# Patient Record
Sex: Male | Born: 1980
Health system: Southern US, Community
[De-identification: ages and names within clinical notes are randomized; demographics above are authoritative.]

## PROBLEM LIST (undated history)

## (undated) DIAGNOSIS — N200 Calculus of kidney: Secondary | ICD-10-CM

## (undated) DIAGNOSIS — I10 Essential (primary) hypertension: Secondary | ICD-10-CM

## (undated) DIAGNOSIS — I428 Other cardiomyopathies: Secondary | ICD-10-CM

## (undated) DIAGNOSIS — I5042 Chronic combined systolic (congestive) and diastolic (congestive) heart failure: Secondary | ICD-10-CM

## (undated) DIAGNOSIS — I2510 Atherosclerotic heart disease of native coronary artery without angina pectoris: Secondary | ICD-10-CM

## (undated) DIAGNOSIS — N289 Disorder of kidney and ureter, unspecified: Secondary | ICD-10-CM

## (undated) DIAGNOSIS — I447 Left bundle-branch block, unspecified: Secondary | ICD-10-CM

## (undated) DIAGNOSIS — G4733 Obstructive sleep apnea (adult) (pediatric): Secondary | ICD-10-CM

## (undated) HISTORY — DX: Calculus of kidney: N20.0

## (undated) HISTORY — DX: Morbid (severe) obesity due to excess calories: E66.01

## (undated) HISTORY — DX: Chronic combined systolic (congestive) and diastolic (congestive) heart failure: I50.42

## (undated) HISTORY — DX: Atherosclerotic heart disease of native coronary artery without angina pectoris: I25.10

## (undated) HISTORY — DX: Other cardiomyopathies: I42.8

## (undated) HISTORY — DX: Essential (primary) hypertension: I10

## (undated) HISTORY — DX: Left bundle-branch block, unspecified: I44.7

## (undated) HISTORY — DX: Obstructive sleep apnea (adult) (pediatric): G47.33

---

## 2013-03-22 ENCOUNTER — Ambulatory Visit (INDEPENDENT_AMBULATORY_CARE_PROVIDER_SITE_OTHER): Payer: BC Managed Care – PPO | Admitting: Emergency Medicine

## 2013-03-22 VITALS — BP 138/98 | HR 88 | Temp 99.9°F | Resp 18 | Ht 67.75 in | Wt 310.8 lb

## 2013-03-22 DIAGNOSIS — G4700 Insomnia, unspecified: Secondary | ICD-10-CM

## 2013-03-22 DIAGNOSIS — R490 Dysphonia: Secondary | ICD-10-CM

## 2013-03-22 DIAGNOSIS — J309 Allergic rhinitis, unspecified: Secondary | ICD-10-CM

## 2013-03-22 MED ORDER — FLUTICASONE PROPIONATE 50 MCG/ACT NA SUSP: 2.0000 | Freq: Every day | NASAL | Status: DC

## 2013-03-22 MED ORDER — BENZONATATE 100 MG PO CAPS: 100.0000 mg | ORAL_CAPSULE | Freq: Three times a day (TID) | ORAL | Status: DC | PRN

## 2013-03-22 NOTE — Patient Instructions (Signed)
Allergic Rhinitis Allergic rhinitis is when the mucous membranes in the nose respond to allergens. Allergens are particles in the air that cause your body to have an allergic reaction. This causes you to release allergic antibodies. Through a chain of events, these eventually cause you to release histamine into the blood stream (hence the use of antihistamines). Although meant to be protective to the body, it is this release that causes your discomfort, such as frequent sneezing, congestion and an itchy runny nose.  CAUSES  The pollen allergens may come from grasses, trees, and weeds. This is seasonal allergic rhinitis, or "hay fever." Other allergens cause year-round allergic rhinitis (perennial allergic rhinitis) such as house dust mite allergen, pet dander and mold spores.  SYMPTOMS   Nasal stuffiness (congestion).  Runny, itchy nose with sneezing and tearing of the eyes.  There is often an itching of the mouth, eyes and ears. It cannot be cured, but it can be controlled with medications. DIAGNOSIS  If you are unable to determine the offending allergen, skin or blood testing may find it. TREATMENT   Avoid the allergen.  Medications and allergy shots (immunotherapy) can help.  Hay fever may often be treated with antihistamines in pill or nasal spray forms. Antihistamines block the effects of histamine. There are over-the-counter medicines that may help with nasal congestion and swelling around the eyes. Check with your caregiver before taking or giving this medicine. If the treatment above does not work, there are many new medications your caregiver can prescribe. Stronger medications may be used if initial measures are ineffective. Desensitizing injections can be used if medications and avoidance fails. Desensitization is when a patient is given ongoing shots until the body becomes less sensitive to the allergen. Make sure you follow up with your caregiver if problems continue. SEEK MEDICAL  CARE IF:   You develop fever (more than 100.5 F (38.1 C).  You develop a cough that does not stop easily (persistent).  You have shortness of breath.  You start wheezing.  Symptoms interfere with normal daily activities. Document Released: 03/07/2001 Document Revised: 09/04/2011 Document Reviewed: 09/16/2008 ExitCare Patient Information 2014 [employer].  

## 2013-03-22 NOTE — Progress Notes (Signed)
  Subjective:    Patient ID: Alex Allen, male    DOB: 02-14-1981, 32 y.o.   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 States that upon weather changing he has had congestion and trouble controlling body temperature. Has vomited due to issues with coughing. Usually has troubles with these symptoms when seasons change. Denies anyone being sick at home, asthma, or ever having to use inhalers. Denies any one he has been around as being sick, to his knowledge.  Review of Systems     Objective:   Physical Exam patient is alert and cooperative. He does have a huskiness to his voice. TMs are clear. Nose is normal. Throat exam reveals some edema of the uvula but otherwise is unremarkable. His chest is clear to both auscultation and percussion. No wheezes are heard.  Results for orders placed in visit on 03/22/13  POCT RAPID STREP A (OFFICE)      Result Value Range   Rapid Strep A Screen Negative  Negative  GLUCOSE, POCT (MANUAL RESULT ENTRY)      Result Value Range   POC Glucose 99  70 - 99 mg/dl        Assessment & Plan:  Patient placed on Tessalon Perles Flonase and he will take Zyrtec one a day.

## 2013-03-24 ENCOUNTER — Other Ambulatory Visit: Payer: Self-pay | Admitting: Emergency Medicine

## 2013-03-24 DIAGNOSIS — J020 Streptococcal pharyngitis: Secondary | ICD-10-CM

## 2013-03-24 LAB — CULTURE, GROUP A STREP

## 2013-03-24 MED ORDER — AMOXICILLIN 875 MG PO TABS: 875.0000 mg | ORAL_TABLET | Freq: Two times a day (BID) | ORAL | Status: DC

## 2013-03-25 ENCOUNTER — Telehealth: Payer: Self-pay

## 2013-03-25 NOTE — Telephone Encounter (Signed)
Message copied by Fidel Levy on Tue Mar 25, 2013 11:44 AM ------      Message from: [place], [facility]      Created: Tue Mar 25, 2013  9:48 AM                   ----- Message -----         From: Collene Gobble, MD         Sent: 03/24/2013   8:02 PM           To: [facility] Clinical Message Pool            Call patient. Culture positive for strep.I sent in a presccription for amoxicillen. ------

## 2013-03-25 NOTE — Telephone Encounter (Signed)
Patient needs note for work for today.  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  Note written.

## 2016-05-08 ENCOUNTER — Ambulatory Visit (INDEPENDENT_AMBULATORY_CARE_PROVIDER_SITE_OTHER): Payer: BC Managed Care – PPO | Admitting: Family Medicine

## 2016-05-08 VITALS — BP 148/108 | HR 87 | Temp 98.7°F | Resp 16 | Ht 68.0 in | Wt 311.0 lb

## 2016-05-08 DIAGNOSIS — M5416 Radiculopathy, lumbar region: Secondary | ICD-10-CM

## 2016-05-08 DIAGNOSIS — Z23 Encounter for immunization: Secondary | ICD-10-CM

## 2016-05-08 DIAGNOSIS — I10 Essential (primary) hypertension: Secondary | ICD-10-CM

## 2016-05-08 MED ORDER — GABAPENTIN 100 MG PO CAPS: 100.0000 mg | ORAL_CAPSULE | Freq: Three times a day (TID) | ORAL | 3 refills | Status: DC

## 2016-05-08 MED ORDER — PREDNISONE 20 MG PO TABS: ORAL_TABLET | ORAL | 0 refills | Status: DC

## 2016-05-08 MED ORDER — LOSARTAN POTASSIUM 50 MG PO TABS: 50.0000 mg | ORAL_TABLET | Freq: Every day | ORAL | 3 refills | Status: DC

## 2016-05-08 MED ORDER — HYDROCHLOROTHIAZIDE 12.5 MG PO CAPS: 12.5000 mg | ORAL_CAPSULE | Freq: Every day | ORAL | 0 refills | Status: DC

## 2016-05-08 MED ORDER — LOSARTAN POTASSIUM 50 MG PO TABS: 50.0000 mg | ORAL_TABLET | Freq: Every day | ORAL | 0 refills | Status: DC

## 2016-05-08 MED ORDER — GABAPENTIN 100 MG PO CAPS: 100.0000 mg | ORAL_CAPSULE | Freq: Three times a day (TID) | ORAL | 1 refills | Status: DC

## 2016-05-08 MED ORDER — HYDROCHLOROTHIAZIDE 12.5 MG PO CAPS
12.5000 mg | ORAL_CAPSULE | Freq: Every day | ORAL | 0 refills | Status: DC
Start: 2016-05-08 — End: 2020-09-22

## 2016-05-08 NOTE — Patient Instructions (Addendum)
Lumbar Radiculopathy  Take Prednisone 20 mg,  in mornings with breakfast as follows:  Take 3 pills for 3 days, Take 2 pills for 3 days, and Take 1 pill for 3 days.  Complete all medication.   May take Gabapentin 100 mg up to 3 times daily for low back and hip pain.  Hypertension  Start Losartan 50 mg daily and Hydrochlorothiazide 12.5 mg daily. Medication refills have already been sent.    IF you received an x-ray today, you will receive an invoice from [facility]. Please contact [facility] at [phone] with questions or concerns regarding your invoice.   IF you received labwork today, you will receive an invoice from [employer]. Please contact [facility] at [phone] with questions or concerns regarding your invoice.   Our billing staff will not be able to assist you with questions regarding bills from these companies.  You will be contacted with the lab results as soon as they are available. The fastest way to get your results is to activate your My Chart account. Instructions are located on the last page of this paperwork. If you have not heard from [place] regarding the results in 2 weeks, please contact this office.     Hypertension Hypertension, commonly called high blood pressure, is when the force of blood pumping through your arteries is too strong. Your arteries are the blood vessels that carry blood from your heart throughout your body. A blood pressure reading consists of a higher number over a lower number, such as 110/72. The higher number (systolic) is the pressure inside your arteries when your heart pumps. The lower number (diastolic) is the pressure inside your arteries when your heart relaxes. Ideally you want your blood pressure below 120/80. Hypertension forces your heart to work harder to pump blood. Your arteries may become narrow or stiff. Having untreated or uncontrolled hypertension can cause heart attack, stroke,  kidney disease, and other problems. RISK FACTORS Some risk factors for high blood pressure are controllable. Others are not.  Risk factors you cannot control include:   Race. You may be at higher risk if you are African American.  Age. Risk increases with age.  Gender. Men are at higher risk than women before age 11 years. After age 51, women are at higher risk than men. Risk factors you can control include:  Not getting enough exercise or physical activity.  Being overweight.  Getting too much fat, sugar, calories, or salt in your diet.  Drinking too much alcohol. SIGNS AND SYMPTOMS Hypertension does not usually cause signs or symptoms. Extremely high blood pressure (hypertensive crisis) may cause headache, anxiety, shortness of breath, and nosebleed. DIAGNOSIS To check if you have hypertension, your health care provider will measure your blood pressure while you are seated, with your arm held at the level of your heart. It should be measured at least twice using the same arm. Certain conditions can cause a difference in blood pressure between your right and left arms. A blood pressure reading that is higher than normal on one occasion does not mean that you need treatment. If it is not clear whether you have high blood pressure, you may be asked to return on a different day to have your blood pressure checked again. Or, you may be asked to monitor your blood pressure at home for 1 or more weeks. TREATMENT Treating high blood pressure includes making lifestyle changes and possibly taking medicine. Living a healthy lifestyle can help lower high blood pressure.  You may need to change some of your habits. Lifestyle changes may include:  Following the DASH diet. This diet is high in fruits, vegetables, and whole grains. It is low in salt, red meat, and added sugars.  Keep your sodium intake below 2,300 mg per day.  Getting at least 30-45 minutes of aerobic exercise at least 4 times per  week.  Losing weight if necessary.  Not smoking.  Limiting alcoholic beverages.  Learning ways to reduce stress. Your health care provider may prescribe medicine if lifestyle changes are not enough to get your blood pressure under control, and if one of the following is true:  You are 4118-10159 years of age and your systolic blood pressure is above 140.  You are 36 years of age or older, and your systolic blood pressure is above 150.  Your diastolic blood pressure is above 90.  You have diabetes, and your systolic blood pressure is over 140 or your diastolic blood pressure is over 90.  You have kidney disease and your blood pressure is above 140/90.  You have heart disease and your blood pressure is above 140/90. Your personal target blood pressure may vary depending on your medical conditions, your age, and other factors. HOME CARE INSTRUCTIONS  Have your blood pressure rechecked as directed by your health care provider.   Take medicines only as directed by your health care provider. Follow the directions carefully. Blood pressure medicines must be taken as prescribed. The medicine does not work as well when you skip doses. Skipping doses also puts you at risk for problems.  Do not smoke.   Monitor your blood pressure at home as directed by your health care provider. SEEK MEDICAL CARE IF:   You think you are having a reaction to medicines taken.  You have recurrent headaches or feel dizzy.  You have swelling in your ankles.  You have trouble with your vision. SEEK IMMEDIATE MEDICAL CARE IF:  You develop a severe headache or confusion.  You have unusual weakness, numbness, or feel faint.  You have severe chest or abdominal pain.  You vomit repeatedly.  You have trouble breathing. MAKE SURE YOU:   Understand these instructions.  Will watch your condition.  Will get help right away if you are not doing well or get worse.   This information is not intended to  replace advice given to you by your health care provider. Make sure you discuss any questions you have with your health care provider.   Document Released: 06/12/2005 Document Revised: 10/27/2014 Document Reviewed: 04/04/2013 Elsevier Interactive Patient Education 2016 Elsevier Inc.  Sciatica Sciatica is pain, weakness, numbness, or tingling along your sciatic nerve. The nerve starts in the lower back and runs down the back of each leg. Nerve damage or certain conditions pinch or put pressure on the sciatic nerve. This causes the pain, weakness, and other discomforts of sciatica. HOME CARE   Only take medicine as told by your doctor.  Apply ice to the affected area for 20 minutes. Do this 3-4 times a day for the first 48-72 hours. Then try heat in the same way.  Exercise, stretch, or do your usual activities if these do not make your pain worse.  Go to physical therapy as told by your doctor.  Keep all doctor visits as told.  Do not wear high heels or shoes that are not supportive.  Get a firm mattress if your mattress is too soft to lessen pain and discomfort. GET HELP RIGHT  AWAY IF:   You cannot control when you poop (bowel movement) or pee (urinate).  You have more weakness in your lower back, lower belly (pelvis), butt (buttocks), or legs.  You have redness or puffiness (swelling) of your back.  You have a burning feeling when you pee.  You have pain that gets worse when you lie down.  You have pain that wakes you from your sleep.  Your pain is worse than past pain.  Your pain lasts longer than 4 weeks.  You are suddenly losing weight without reason. MAKE SURE YOU:   Understand these instructions.  Will watch this condition.  Will get help right away if you are not doing well or get worse.   This information is not intended to replace advice given to you by your health care provider. Make sure you discuss any questions you have with your health care provider.    Document Released: 03/21/2008 Document Revised: 03/03/2015 Document Reviewed: 10/22/2011 Elsevier Interactive Patient Education [employer]
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
## 2016-05-09 ENCOUNTER — Encounter: Payer: Self-pay | Admitting: Family Medicine

## 2016-05-09 ENCOUNTER — Telehealth: Payer: Self-pay

## 2016-05-09 LAB — COMPLETE METABOLIC PANEL WITH GFR
ALBUMIN: 4.3 g/dL (ref 3.6–5.1)
ALK PHOS: 94 U/L (ref 40–115)
ALT: 34 U/L (ref 9–46)
AST: 28 U/L (ref 10–40)
BILIRUBIN TOTAL: 0.4 mg/dL (ref 0.2–1.2)
BUN: 12 mg/dL (ref 7–25)
CO2: 27 mmol/L (ref 20–31)
Calcium: 9.6 mg/dL (ref 8.6–10.3)
Chloride: 102 mmol/L (ref 98–110)
Creat: 0.97 mg/dL (ref 0.60–1.35)
GFR, Est African American: >89 mL/min (ref >=60)
GFR, Est Non African American: >89 mL/min (ref >=60)
GLUCOSE: 85 mg/dL (ref 65–99)
Potassium: 4.1 mmol/L (ref 3.5–5.3)
SODIUM: 141 mmol/L (ref 135–146)
TOTAL PROTEIN: 7.6 g/dL (ref 6.1–8.1)

## 2016-05-09 LAB — CBC WITH DIFFERENTIAL/PLATELET
BASOS ABS: 0 {cells}/uL (ref 0–200)
Basophils Relative: 0 %
EOS ABS: 93 {cells}/uL (ref 15–500)
EOS PCT: 1 %
HCT: 47.9 % (ref 38.5–50.0)
HEMOGLOBIN: 16.3 g/dL (ref 13.2–17.1)
LYMPHS ABS: 2604 {cells}/uL (ref 850–3900)
Lymphocytes Relative: 28 %
MCH: 28.2 pg (ref 27.0–33.0)
MCHC: 34 g/dL (ref 32.0–36.0)
MCV: 82.7 fL (ref 80.0–100.0)
MONOS PCT: 9 %
MPV: 10.8 fL (ref 7.5–12.5)
Monocytes Absolute: 837 cells/uL (ref 200–950)
NEUTROS PCT: 62 %
Neutro Abs: 5766 cells/uL (ref 1500–7800)
Platelets: 224 10*3/uL (ref 140–400)
RBC: 5.79 MIL/uL (ref 4.20–5.80)
RDW: 14.3 % (ref 11.0–15.0)
WBC: 9.3 10*3/uL (ref 3.8–10.8)

## 2016-05-09 LAB — LIPID PANEL
CHOL/HDL RATIO: 4 ratio (ref <5.0)
CHOLESTEROL: 145 mg/dL (ref <200)
HDL: 36 mg/dL — AB (ref >40)
LDL Cholesterol: 90 mg/dL (ref <100)
TRIGLYCERIDES: 97 mg/dL (ref <150)
VLDL: 19 mg/dL (ref <30)

## 2016-05-09 LAB — THYROID PANEL WITH TSH
Free Thyroxine Index: 2.8 (ref 1.4–3.8)
T3 Uptake: 28 % (ref 22–35)
T4 TOTAL: 10 ug/dL (ref 4.5–12.0)
TSH: 1.62 m[IU]/L (ref 0.40–4.50)

## 2016-05-09 NOTE — Progress Notes (Signed)
Dear Alex Allen,  Below are the results from your recent visit were as expected.  Resulted Orders  COMPLETE METABOLIC PANEL WITH GFR  Result Value Ref Range   Sodium 141 135 - 146 mmol/L   Potassium 4.1 3.5 - 5.3 mmol/L   Chloride 102 98 - 110 mmol/L   CO2 27 20 - 31 mmol/L   Glucose, Bld 85 65 - 99 mg/dL   BUN 12 7 - 25 mg/dL   Creat 0.97 0.60 - 1.35 mg/dL   Total Bilirubin 0.4 0.2 - 1.2 mg/dL   Alkaline Phosphatase 94 40 - 115 U/L   AST 28 10 - 40 U/L   ALT 34 9 - 46 U/L   Total Protein 7.6 6.1 - 8.1 g/dL   Albumin 4.3 3.6 - 5.1 g/dL   Calcium 9.6 8.6 - 10.3 mg/dL   GFR, Est African American >89 >=60 mL/min   GFR, Est Non African American >89 >=60 mL/min   Narrative   Performed at:  [facility]                [address], [address]                [place], [place] [address]  CBC with Differential/Platelet  Result Value Ref Range   WBC 9.3 3.8 - 10.8 K/uL   RBC 5.79 4.20 - 5.80 MIL/uL   Hemoglobin 16.3 13.2 - 17.1 g/dL   HCT 47.9 38.5 - 50.0 %   MCV 82.7 80.0 - 100.0 fL   MCH 28.2 27.0 - 33.0 pg   MCHC 34.0 32.0 - 36.0 g/dL   RDW 14.3 11.0 - 15.0 %   Platelets 224 140 - 400 K/uL   MPV 10.8 7.5 - 12.5 fL   Neutro Abs 5,766 1,500 - 7,800 cells/uL   Lymphs Abs 2,604 850 - 3,900 cells/uL   Monocytes Absolute 837 200 - 950 cells/uL   Eosinophils Absolute 93 15 - 500 cells/uL   Basophils Absolute 0 0 - 200 cells/uL   Neutrophils Relative % 62 %   Lymphocytes Relative 28 %   Monocytes Relative 9 %   Eosinophils Relative 1 %   Basophils Relative 0 %   Smear Review Criteria for review not met    Narrative   Performed at:  [facility]                [address]                [place], [place] [address]  Thyroid Panel With TSH  Result Value Ref Range   T4, Total 10.0 4.5 - 12.0 ug/dL   T3 Uptake 28 22 - 35 %   Free Thyroxine Index 2.8 1.4 - 3.8   TSH 1.62 0.40 - 4.50 mIU/L   Narrative   Performed at:  [address], [address]                [place], [place] [address]  Lipid panel  Result Value Ref Range   Cholesterol 145 <200 mg/dL     Comment:     ** Please note change in reference range(s). **      Triglycerides 97 <150 mg/dL     Comment:     ** Please note change in reference range(s). **      HDL 36 (L) >40 mg/dL     Comment:     ** Please note change in reference range(s). **  Total CHOL/HDL Ratio 4.0 <5.0 Ratio   VLDL 19 <30 mg/dL   LDL Cholesterol 90 <100 mg/dL     Comment:     ** Please note change in reference range(s). **      Narrative   Performed at:  [address], [address]                [place], [place] [address]     If you have any questions or concerns, please don't hesitate to call.  Sincerely,   Alex Barrows, FNP

## 2016-05-09 NOTE — Telephone Encounter (Signed)
PATIENT STATES HE SAW KIMBERLY HARRIS ON Monday FOR HIS BACK PAIN. SHE WROTE HIM A NOTE TO RETURN TO WORK ON Wednesday NOV. 15, 2017. HE SAID WHEN HE STANDS FOR LONGER THAN 10 MINUTES HE GETS A SHOOTING PAIN UP HIS (L) LEG. 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 IF KIMBERLY THINKS HE SHOULD RETURN TO WORK ON Wednesday THAN HE NEEDS A NEW NOTE PUTTING HIM ON LIMITED DUTY SO THAT HE CAN WORK SEATED. HE SAID THE PAIN IS BETTER THAN IT WAS Monday. BEST PHONE [phone] (CELL) PLEASE CALL HIM AS SOON AS POSSIBLE.  MBC

## 2016-05-11 ENCOUNTER — Encounter: Payer: Self-pay | Admitting: Family Medicine

## 2016-05-11 NOTE — Telephone Encounter (Signed)
Call patient to advise patient I can give him a note placing him on limited duties at work or if he prefers I can write him out the remainder of the week. Please advise.

## 2016-05-11 NOTE — Telephone Encounter (Signed)
LMOM for pt giving him Kim's message.
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
## 2016-05-11 NOTE — Telephone Encounter (Signed)
Patient states that he is still having pain in his leg and he doesn't know if it is a good idea or not for him to be driving with a hurt leg since he is still getting sharp pains. He states he is having to go to work today because his job can not cover him but he would like to speak with Joaquin Courts about what he needs to do about this. Told him he may have to come in and be seen again but he wanted me to put in another phone message and see if she would just call him and talk to him about this.  His call back number is [phone].

## 2016-05-11 NOTE — Telephone Encounter (Signed)
Call patient to notify him a letter is at the front desk writing him out of work up until 05/16/16 and the letter indicates that he may return sooner if symptoms improve.

## 2016-05-16 ENCOUNTER — Ambulatory Visit
Admission: RE | Admit: 2016-05-16 | Discharge: 2016-05-16 | Disposition: A | Discharge status: Home / Self Care | Payer: Self-pay | Source: Ambulatory Visit | Attending: Family Medicine | Admitting: Family Medicine

## 2016-05-16 ENCOUNTER — Other Ambulatory Visit: Payer: Self-pay | Admitting: Family Medicine

## 2016-05-16 DIAGNOSIS — R52 Pain, unspecified: Secondary | ICD-10-CM

## 2020-08-24 DIAGNOSIS — I428 Other cardiomyopathies: Secondary | ICD-10-CM

## 2020-08-24 HISTORY — DX: Other cardiomyopathies: I42.8

## 2020-09-19 ENCOUNTER — Emergency Department (HOSPITAL_COMMUNITY): Payer: BC Managed Care – PPO

## 2020-09-19 ENCOUNTER — Inpatient Hospital Stay (HOSPITAL_COMMUNITY)
Admission: EM | Admit: 2020-09-19 | Discharge: 2020-09-22 | DRG: 286 | Disposition: A | Discharge status: Home / Self Care | Payer: BC Managed Care – PPO | Attending: Internal Medicine | Admitting: Internal Medicine

## 2020-09-19 ENCOUNTER — Other Ambulatory Visit: Payer: Self-pay

## 2020-09-19 ENCOUNTER — Encounter (HOSPITAL_COMMUNITY): Payer: Self-pay | Admitting: Emergency Medicine

## 2020-09-19 DIAGNOSIS — J810 Acute pulmonary edema: Secondary | ICD-10-CM

## 2020-09-19 DIAGNOSIS — Z6841 Body Mass Index (BMI) 40.0 and over, adult: Secondary | ICD-10-CM

## 2020-09-19 DIAGNOSIS — R739 Hyperglycemia, unspecified: Secondary | ICD-10-CM | POA: Diagnosis present

## 2020-09-19 DIAGNOSIS — J189 Pneumonia, unspecified organism: Secondary | ICD-10-CM | POA: Diagnosis present

## 2020-09-19 DIAGNOSIS — F17290 Nicotine dependence, other tobacco product, uncomplicated: Secondary | ICD-10-CM | POA: Diagnosis present

## 2020-09-19 DIAGNOSIS — I25110 Atherosclerotic heart disease of native coronary artery with unstable angina pectoris: Secondary | ICD-10-CM | POA: Diagnosis present

## 2020-09-19 DIAGNOSIS — I2489 Other forms of acute ischemic heart disease: Secondary | ICD-10-CM | POA: Diagnosis present

## 2020-09-19 DIAGNOSIS — I5041 Acute combined systolic (congestive) and diastolic (congestive) heart failure: Secondary | ICD-10-CM | POA: Clinically undetermined

## 2020-09-19 DIAGNOSIS — Z8249 Family history of ischemic heart disease and other diseases of the circulatory system: Secondary | ICD-10-CM

## 2020-09-19 DIAGNOSIS — I447 Left bundle-branch block, unspecified: Secondary | ICD-10-CM | POA: Diagnosis present

## 2020-09-19 DIAGNOSIS — Z83438 Family history of other disorder of lipoprotein metabolism and other lipidemia: Secondary | ICD-10-CM

## 2020-09-19 DIAGNOSIS — I248 Other forms of acute ischemic heart disease: Secondary | ICD-10-CM | POA: Diagnosis present

## 2020-09-19 DIAGNOSIS — I214 Non-ST elevation (NSTEMI) myocardial infarction: Secondary | ICD-10-CM | POA: Insufficient documentation

## 2020-09-19 DIAGNOSIS — I161 Hypertensive emergency: Secondary | ICD-10-CM | POA: Diagnosis present

## 2020-09-19 DIAGNOSIS — Z833 Family history of diabetes mellitus: Secondary | ICD-10-CM

## 2020-09-19 DIAGNOSIS — Z79899 Other long term (current) drug therapy: Secondary | ICD-10-CM

## 2020-09-19 DIAGNOSIS — E1165 Type 2 diabetes mellitus with hyperglycemia: Secondary | ICD-10-CM | POA: Diagnosis present

## 2020-09-19 DIAGNOSIS — R6510 Systemic inflammatory response syndrome (SIRS) of non-infectious origin without acute organ dysfunction: Secondary | ICD-10-CM | POA: Diagnosis present

## 2020-09-19 DIAGNOSIS — R0602 Shortness of breath: Secondary | ICD-10-CM

## 2020-09-19 DIAGNOSIS — I428 Other cardiomyopathies: Secondary | ICD-10-CM

## 2020-09-19 DIAGNOSIS — I110 Hypertensive heart disease with heart failure: Secondary | ICD-10-CM | POA: Diagnosis not present

## 2020-09-19 DIAGNOSIS — E876 Hypokalemia: Secondary | ICD-10-CM | POA: Diagnosis present

## 2020-09-19 DIAGNOSIS — E785 Hyperlipidemia, unspecified: Secondary | ICD-10-CM | POA: Diagnosis present

## 2020-09-19 DIAGNOSIS — Z20822 Contact with and (suspected) exposure to covid-19: Secondary | ICD-10-CM | POA: Diagnosis present

## 2020-09-19 DIAGNOSIS — J9600 Acute respiratory failure, unspecified whether with hypoxia or hypercapnia: Secondary | ICD-10-CM | POA: Diagnosis not present

## 2020-09-19 DIAGNOSIS — J9601 Acute respiratory failure with hypoxia: Secondary | ICD-10-CM | POA: Diagnosis present

## 2020-09-19 LAB — TROPONIN I (HIGH SENSITIVITY)
Troponin I (High Sensitivity): 56 ng/L — ABNORMAL HIGH (ref <18)
Troponin I (High Sensitivity): 309 ng/L (ref <18)

## 2020-09-19 LAB — BASIC METABOLIC PANEL
Anion gap: 10 (ref 5–15)
BUN: 9 mg/dL (ref 6–20)
CO2: 27 mmol/L (ref 22–32)
Calcium: 8.6 mg/dL — ABNORMAL LOW (ref 8.9–10.3)
Chloride: 99 mmol/L (ref 98–111)
Creatinine, Ser: 1.28 mg/dL — ABNORMAL HIGH (ref 0.61–1.24)
GFR, Estimated: >60 mL/min (ref >60)
Glucose, Bld: 205 mg/dL — ABNORMAL HIGH (ref 70–99)
Potassium: 3 mmol/L — ABNORMAL LOW (ref 3.5–5.1)
Sodium: 136 mmol/L (ref 135–145)

## 2020-09-19 LAB — CBC
HCT: 50.9 % (ref 39.0–52.0)
Hemoglobin: 17 g/dL (ref 13.0–17.0)
MCH: 28.7 pg (ref 26.0–34.0)
MCHC: 33.4 g/dL (ref 30.0–36.0)
MCV: 85.8 fL (ref 80.0–100.0)
Platelets: 271 10*3/uL (ref 150–400)
RBC: 5.93 MIL/uL — ABNORMAL HIGH (ref 4.22–5.81)
RDW: 13.6 % (ref 11.5–15.5)
WBC: 16.5 10*3/uL — ABNORMAL HIGH (ref 4.0–10.5)
nRBC: 0 % (ref 0.0–0.2)

## 2020-09-19 LAB — URINALYSIS, COMPLETE (UACMP) WITH MICROSCOPIC
Bilirubin Urine: NEGATIVE
Glucose, UA: NEGATIVE mg/dL
Ketones, ur: NEGATIVE mg/dL
Leukocytes,Ua: NEGATIVE
Nitrite: NEGATIVE
Protein, ur: 100 mg/dL — AB
Specific Gravity, Urine: 1.006 (ref 1.005–1.030)
pH: 6 (ref 5.0–8.0)

## 2020-09-19 LAB — I-STAT VENOUS BLOOD GAS, ED
Acid-Base Excess: 2 mmol/L (ref 0.0–2.0)
Bicarbonate: 29.9 mmol/L — ABNORMAL HIGH (ref 20.0–28.0)
Calcium, Ion: 1.03 mmol/L — ABNORMAL LOW (ref 1.15–1.40)
HCT: 53 % — ABNORMAL HIGH (ref 39.0–52.0)
Hemoglobin: 18 g/dL — ABNORMAL HIGH (ref 13.0–17.0)
O2 Saturation: 82 %
Potassium: 3.1 mmol/L — ABNORMAL LOW (ref 3.5–5.1)
Sodium: 139 mmol/L (ref 135–145)
TCO2: 32 mmol/L (ref 22–32)
pCO2, Ven: 56 mmHg (ref 44.0–60.0)
pH, Ven: 7.335 (ref 7.250–7.430)
pO2, Ven: 50 mmHg — ABNORMAL HIGH (ref 32.0–45.0)

## 2020-09-19 LAB — RESP PANEL BY RT-PCR (FLU A&B, COVID) ARPGX2
Influenza A by PCR: NEGATIVE
Influenza B by PCR: NEGATIVE
SARS Coronavirus 2 by RT PCR: NEGATIVE

## 2020-09-19 LAB — RAPID URINE DRUG SCREEN, HOSP PERFORMED
Amphetamines: NOT DETECTED
Barbiturates: NOT DETECTED
Benzodiazepines: NOT DETECTED
Cocaine: NOT DETECTED
Opiates: NOT DETECTED
Tetrahydrocannabinol: NOT DETECTED

## 2020-09-19 LAB — MAGNESIUM: Magnesium: 2 mg/dL (ref 1.7–2.4)

## 2020-09-19 LAB — BRAIN NATRIURETIC PEPTIDE: B Natriuretic Peptide: 181.5 pg/mL — ABNORMAL HIGH (ref 0.0–100.0)

## 2020-09-19 MED ORDER — HYDRALAZINE HCL 20 MG/ML IJ SOLN: 10.0000 mg | Freq: Four times a day (QID) | INTRAMUSCULAR | Status: DC | PRN

## 2020-09-19 MED ORDER — SODIUM CHLORIDE 0.9% FLUSH: 3.0000 mL | INTRAVENOUS | Status: DC | PRN

## 2020-09-19 MED ORDER — SODIUM CHLORIDE 0.9% FLUSH
3.0000 mL | Freq: Two times a day (BID) | INTRAVENOUS | Status: DC
  Administered 2020-09-19 – 2020-09-20 (×3): 3 mL via INTRAVENOUS

## 2020-09-19 MED ORDER — SODIUM CHLORIDE 0.9 % IV SOLN: 250.0000 mL | INTRAVENOUS | Status: DC | PRN

## 2020-09-19 MED ORDER — NITROGLYCERIN IN D5W 200-5 MCG/ML-% IV SOLN
5.0000 ug/min | INTRAVENOUS | Status: DC
  Administered 2020-09-19: 5 ug/min via INTRAVENOUS
  Filled 2020-09-19: qty 250

## 2020-09-19 MED ORDER — FUROSEMIDE 10 MG/ML IJ SOLN
40.0000 mg | Freq: Once | INTRAMUSCULAR | Status: AC
  Administered 2020-09-19: 40 mg via INTRAVENOUS
  Filled 2020-09-19: qty 4

## 2020-09-19 MED ORDER — FUROSEMIDE 10 MG/ML IJ SOLN
40.0000 mg | Freq: Two times a day (BID) | INTRAMUSCULAR | Status: DC
  Administered 2020-09-20 (×2): 40 mg via INTRAVENOUS
  Filled 2020-09-19 (×2): qty 4

## 2020-09-19 MED ORDER — LISINOPRIL 20 MG PO TABS
20.0000 mg | ORAL_TABLET | Freq: Every day | ORAL | Status: DC
  Administered 2020-09-19 – 2020-09-20 (×2): 20 mg via ORAL
  Filled 2020-09-19 (×2): qty 1

## 2020-09-19 MED ORDER — ONDANSETRON HCL 4 MG/2ML IJ SOLN: 4.0000 mg | Freq: Four times a day (QID) | INTRAMUSCULAR | Status: DC | PRN

## 2020-09-19 MED ORDER — HEPARIN (PORCINE) 25000 UT/250ML-% IV SOLN
1950.0000 [IU]/h | INTRAVENOUS | Status: DC
  Administered 2020-09-19: 1250 [IU]/h via INTRAVENOUS
  Administered 2020-09-20: 1600 [IU]/h via INTRAVENOUS
  Administered 2020-09-21: 1950 [IU]/h via INTRAVENOUS
  Filled 2020-09-19 (×3): qty 250

## 2020-09-19 MED ORDER — CARVEDILOL 3.125 MG PO TABS
3.1250 mg | ORAL_TABLET | Freq: Two times a day (BID) | ORAL | Status: DC
  Administered 2020-09-19 – 2020-09-22 (×6): 3.125 mg via ORAL
  Filled 2020-09-19 (×6): qty 1

## 2020-09-19 MED ORDER — ACETAMINOPHEN 325 MG PO TABS
650.0000 mg | ORAL_TABLET | ORAL | Status: DC | PRN
  Filled 2020-09-19: qty 2

## 2020-09-19 MED ORDER — HEPARIN BOLUS VIA INFUSION
4000.0000 [IU] | Freq: Once | INTRAVENOUS | Status: AC
  Administered 2020-09-19: 4000 [IU] via INTRAVENOUS
  Filled 2020-09-19: qty 4000

## 2020-09-19 MED ORDER — ASPIRIN 325 MG PO TABS
325.0000 mg | ORAL_TABLET | Freq: Every day | ORAL | Status: DC
  Administered 2020-09-19 – 2020-09-20 (×2): 325 mg via ORAL
  Filled 2020-09-19 (×2): qty 1

## 2020-09-19 MED ORDER — POTASSIUM CHLORIDE 10 MEQ/100ML IV SOLN
10.0000 meq | INTRAVENOUS | Status: AC
  Administered 2020-09-19 (×4): 10 meq via INTRAVENOUS
  Filled 2020-09-19 (×4): qty 100

## 2020-09-19 MED ORDER — ALBUTEROL SULFATE (2.5 MG/3ML) 0.083% IN NEBU: 2.5000 mg | INHALATION_SOLUTION | RESPIRATORY_TRACT | Status: DC | PRN

## 2020-09-19 NOTE — Progress Notes (Signed)
   09/19/20 2231  Assess: MEWS Score  BP (!) 150/111  Pulse Rate 100  Assess: MEWS Score  MEWS Temp 0  MEWS Systolic 0  MEWS Pulse 0  MEWS RR 2  MEWS LOC 0  MEWS Score 2  MEWS Score Color Yellow  Assess: if the MEWS score is Yellow or Red  Were vital signs taken at a resting state? Yes  Focused Assessment No change from prior assessment  Early Detection of Sepsis Score *See Row Information* Medium  MEWS guidelines implemented *See Row Information* Yes  Treat  MEWS Interventions Escalated (See documentation below)  Pain Scale 0-10  Take Vital Signs  Increase Vital Sign Frequency  Yellow: Q 2hr X 2 then Q 4hr X 2, if remains yellow, continue Q 4hrs  Escalate  MEWS: Escalate Yellow: discuss with charge nurse/RN and consider discussing with provider and RRT  Notify: Charge Nurse/RN  Name of Charge Nurse/RN Notified Noreene Larsson RN  Date Charge Nurse/RN Notified 09/19/20  Time Charge Nurse/RN Notified 2337  Document  Patient Outcome Not stable and remains on department

## 2020-09-19 NOTE — ED Triage Notes (Signed)
Pt bib [facility], respiratory distress. Pt was watching tv around 1600, started feeling short of breath and had a lot of spit, started as clear, is now pink and frothy. Pt in respiratory distress on arrival.   EMS VS:   BP:  180/ HR=160s RR=40s

## 2020-09-19 NOTE — ED Notes (Signed)
bipap removed, 5L [place] applied

## 2020-09-19 NOTE — Progress Notes (Signed)
ANTICOAGULATION CONSULT NOTE  Pharmacy Consult for Heparin Indication: chest pain/ACS  No Known Allergies  Patient Measurements: Height: 5\' 8"  (172.7 cm) Weight: (!) 140 kg (308 lb 10.3 oz) IBW/kg (Calculated) : 68.4 Heparin Dosing Weight: 101.8 kg  Vital Signs: Temp: 99.8 F (37.7 C) (03/27 2052) Temp Source: Axillary (03/27 2052) BP: 97/77 (03/27 2045) Pulse Rate: 114 (03/27 2045)  Labs: Recent Labs    09/19/20 1723 09/19/20 1737 09/19/20 1924  HGB 17.0 18.0*  --   HCT 50.9 53.0*  --   PLT 271  --   --   CREATININE 1.28*  --   --   TROPONINIHS 56*  --  309*    Estimated Creatinine Clearance: 104.2 mL/min (A) (by C-G formula based on SCr of 1.28 mg/dL (H)).   Medical History: Past Medical History:  Diagnosis Date  . Hypertension     Medications:  Scheduled:  . aspirin  325 mg Oral Daily  . carvedilol  3.125 mg Oral BID WC  . [START ON 09/20/2020] furosemide  40 mg Intravenous BID  . heparin  4,000 Units Intravenous Once  . sodium chloride flush  3 mL Intravenous Q12H    Assessment: Patient is a 28 yom that is being admitted for SOB/ACS. Patient had slightly elevated trops on first collection however this rose on repeat labs. Pharmacy has been asked to dose heparin at this time for ACS.   Goal of Therapy:  Heparin level 0.3-0.7 units/ml Monitor platelets by anticoagulation protocol: Yes   Plan:  - Heparin bolus 4000 units IV x 1 dose - Heparin drip @ 1250 units/hr - Heparin level in ~ 6 hours  - Monitor patient for s/s of bleeding and CBC while on heparin   46 PharmD. BCPS  09/19/2020,9:22 PM

## 2020-09-19 NOTE — ED Notes (Signed)
Attempted to call report, unsuccessful. Will call back.

## 2020-09-19 NOTE — H&P (Signed)
History and Physical    Alex Allen ZPH:[id number] DOB: [birth year] DOA: 09/19/2020  PCP: Bing Neighbors, FNP  Patient coming from:   Home via EMS  Chief Complaint:  Chief Complaint  Patient presents with  . Shortness of Breath     HPI:    40 year old male with past medical history of hypertension, obesity who presents to [facility] emergency department via EMS with sudden onset shortness of breath.  Patient explains that for the past several weeks he has been experiencing intermittent episodes of chest pressure.  Patient describes these episodes of chest pressure as consistently occurring with exertion and consistently resolving with rest.  Patient did not seek medical attention for this.  Patient additionally explains that for the past several days he has been feeling generalized malaise.  Patient denies any cough, fevers, sick contacts, change in appetite, lack of smell or lack of taste.  Patient denies any contacts with confirmed COVID-19 infection.    Patient's plans that shortly after 3 PM today he was lying on his couch watching TV when he suddenly experienced severe shortness of breath with associated chest tightness.  Onset was sudden and severe EMS was contacted who promptly arrived to evaluate the patient.  According to EMS, patient was covered in frothy sputum with initial blood pressures as high as 220 systolic with oxygen saturations as low as the upper 60s.  Patient refused CPAP and therefore was placed on nonrebreather mask and was promptly brought to [facility] emergency department for evaluation.  Upon evaluation in the emergency department, patient was found to be in respiratory distress and was transitioned to BiPAP therapy.  Patient was administered 40 mg intravenous Lasix.  Patient was continued to exhibit blood pressures as high as 230/130 and therefore was initiated on a nitroglycerin drip for blood pressure control.  Chest x-ray revealed  bilateral patchy traits concerning for acute pulmonary edema.  EKG revealed wide complex tachycardia which was reviewed with Dr. Lysle Morales with cardiology.  He was felt that EKG was secondary to a rate related left bundle branch block.  The hospitalist group was then called to assess the patient for admission to the hospital. Review of Systems:   Review of Systems  Constitutional: Positive for malaise/fatigue.  Respiratory: Positive for shortness of breath.   Cardiovascular: Positive for chest pain.  Neurological: Positive for weakness.  All other systems reviewed and are negative.   Past Medical History:  Diagnosis Date  . Hypertension     History reviewed. No pertinent surgical history.   reports that he has been smoking cigars. He has never used smokeless tobacco. He reports current alcohol use. He reports that he does not use drugs.  No Known Allergies  Family History  Problem Relation Age of Onset  . Thyroid disease Mother   . Diabetes Father   . Hypertension Father   . Hyperlipidemia Father      Prior to Admission medications   Medication Sig Start Date End Date Taking? Authorizing Provider  gabapentin (NEURONTIN) 100 MG capsule Take 1 capsule (100 mg total) by mouth 3 (three) times daily. 05/08/16   Bing Neighbors, FNP  hydrochlorothiazide (MICROZIDE) 12.5 MG capsule Take 1 capsule (12.5 mg total) by mouth daily. 05/08/16   Bing Neighbors, FNP  losartan (COZAAR) 50 MG tablet Take 1 tablet (50 mg total) by mouth daily. 05/08/16   Bing Neighbors, FNP  predniSONE (DELTASONE) 20 MG tablet Take 3 PO QAM x3days, 2 PO QAM  x3days, 1 PO QAM x3days 05/08/16   Bing Neighbors, FNP    Physical Exam: Vitals:   09/19/20 2015 09/19/20 2030 09/19/20 2045 09/19/20 2052  BP: (!) 139/111 128/73 97/77   Pulse: 60 (!) 118 (!) 114   Resp: (!) 26 (!) 28 (!) 24   Temp:    99.8 F (37.7 C)  TempSrc:    Axillary  SpO2: 98% 97% 98%   Weight:      Height:         Constitutional: Awake alert and oriented x3, patient is in respiratory distress.  Patient is obese. Skin: no rashes, no lesions, good skin turgor noted. Eyes: Pupils are equally reactive to light.  No evidence of scleral icterus or conjunctival pallor.  ENMT: BiPAP mask is in place.     Neck: normal, supple, no masses, no thyromegaly.  No evidence of jugular venous distension.   Respiratory: Coarse breath sounds in all fields with expiratory wheezing with significant rales noted in the bilateral mid and lower fields.  Increased respiratory effort without evidence of accessory muscle use..  Cardiovascular: Tachycardic rate with regular rhythm no murmurs / rubs / gallops.  +1 pitting edema the distal bilateral lower extremities.  2+ pedal pulses. No carotid bruits.  Chest:   Nontender without crepitus or deformity.   Back:   Nontender without crepitus or deformity. Abdomen: Abdomen is soft and nontender.  No evidence of intra-abdominal masses.  Positive bowel sounds noted in all quadrants.   Musculoskeletal: No joint deformity upper and lower extremities. Good ROM, no contractures. Normal muscle tone.  Neurologic: CN 2-12 grossly intact. Sensation intact.  Patient moving all 4 extremities spontaneously.  Patient is following all commands.  Patient is responsive to verbal stimuli.   Psychiatric: Patient exhibits normal mood with appropriate affect.  Patient seems to possess insight as to their current situation.     Labs on Admission: I have personally reviewed following labs and imaging studies -   CBC: Recent Labs  Lab 09/19/20 1723 09/19/20 1737  WBC 16.5*  --   HGB 17.0 18.0*  HCT 50.9 53.0*  MCV 85.8  --   PLT 271  --    Basic Metabolic Panel: Recent Labs  Lab 09/19/20 1723 09/19/20 1737  NA 136 139  K 3.0* 3.1*  CL 99  --   CO2 27  --   GLUCOSE 205*  --   BUN 9  --   CREATININE 1.28*  --   CALCIUM 8.6*  --   MG 2.0  --    GFR: Estimated Creatinine Clearance: 104.2  mL/min (A) (by C-G formula based on SCr of 1.28 mg/dL (H)). Liver Function Tests: No results for input(s): AST, ALT, ALKPHOS, BILITOT, PROT, ALBUMIN in the last 168 hours. No results for input(s): LIPASE, AMYLASE in the last 168 hours. No results for input(s): AMMONIA in the last 168 hours. Coagulation Profile: No results for input(s): INR, PROTIME in the last 168 hours. Cardiac Enzymes: No results for input(s): CKTOTAL, CKMB, CKMBINDEX, TROPONINI in the last 168 hours. BNP (last 3 results) No results for input(s): PROBNP in the last 8760 hours. HbA1C: No results for input(s): HGBA1C in the last 72 hours. CBG: No results for input(s): GLUCAP in the last 168 hours. Lipid Profile: No results for input(s): CHOL, HDL, LDLCALC, TRIG, CHOLHDL, LDLDIRECT in the last 72 hours. Thyroid Function Tests: No results for input(s): TSH, T4TOTAL, FREET4, T3FREE, THYROIDAB in the last 72 hours. Anemia Panel: No results for  input(s): VITAMINB12, FOLATE, FERRITIN, TIBC, IRON, RETICCTPCT in the last 72 hours. Urine analysis:    Component Value Date/Time   COLORURINE YELLOW 09/19/2020 2030   APPEARANCEUR CLEAR 09/19/2020 2030   LABSPEC 1.006 09/19/2020 2030   PHURINE 6.0 09/19/2020 2030   GLUCOSEU NEGATIVE 09/19/2020 2030   HGBUR SMALL (A) 09/19/2020 2030   BILIRUBINUR NEGATIVE 09/19/2020 2030   KETONESUR NEGATIVE 09/19/2020 2030   PROTEINUR 100 (A) 09/19/2020 2030   NITRITE NEGATIVE 09/19/2020 2030   LEUKOCYTESUR NEGATIVE 09/19/2020 2030    Radiological Exams on Admission - Personally Reviewed: DG Chest Portable 1 View  Result Date: 09/19/2020 CLINICAL DATA:  Shortness of breath, respiratory distress, pain conned frothy sputum, tachycardia, tachypnea EXAM: PORTABLE CHEST 1 VIEW COMPARISON:  Portable exam 1737 hours without priors for comparison FINDINGS: Enlargement of cardiac silhouette with pulmonary vascular congestion. Interstitial infiltrates bilaterally slightly greater on RIGHT which could  represent asymmetric pulmonary edema or multifocal infection. No pleural effusion or pneumothorax. Osseous structures unremarkable. IMPRESSION: Enlargement of cardiac silhouette with pulmonary vascular congestion. Asymmetric interstitial infiltrates greater on RIGHT, question asymmetric pulmonary edema versus infection. Electronically Signed   By: Ulyses Southward M.D.   On: 09/19/2020 17:50    EKG: Personally reviewed.  Rhythm is sinus tachycardia with heart rate of 131 bpm.  Evidence of left bundle branch block.   Assessment/Plan Principal Problem:   Acute respiratory failure with hypoxia (HCC)   Patient likely suffering from acute hypoxic respiratory failure secondary to acute cardiogenic pulmonary edema from hypertensive emergency  Patient is already been administered a dose of intravenous Lasix resulted in 600 cc of urine output and some.  We will continue intravenous Lasix is 40 mg twice daily to continue to pursue improvement in respiratory distress.    Patient is additionally been temporarily placed on BiPAP therapy as a temporizing measure which will quickly be weaned off as tolerated  Meanwhile, patient is on a nitroglycerin infusion which will attempt to be weaned off as we initiate oral antihypertensive therapy  Echocardiogram the morning  Placing patient in progressive unit  Active Problems:   Hypertensive emergency   Patient reports over 1 year of not taking any antihypertensives since he reports that his primary care provider took him off all antihypertensives at that time.  Patient presenting with severe hypertension with systolic blood pressures as high as 230 upon arrival to the emergency department, likely prompting his respiratory distress via acute cardiogenic pulmonary edema  Continuing nitroglycerin infusion but will quickly attempt to transition patient to oral antihypertensives through the evening.  Cycling cardiac enzymes  Monitoring patient telemetry  No  evidence of renal injury    NSTEMI (non-ST elevated myocardial infarction) (HCC)   Initial troponin 56 with follow-up troponin found to be 309  While this rise in troponin may all be secondary to hypertensive emergency, patient does endorse a recent history of exertional chest pain and pressure and patient additionally exhibits evidence of a new left bundle branch block  Will initially place patient on aspirin and heparin infusion and as we attempt to treat patient's blood pressure and respiratory failure  Echocardiogram ordered for the morning  Continue to cycle cardiac enzymes throughout the evening  Discussed with Dr. Lysle Morales with cardiology who agrees that plaque rupture is probably not the case but agrees with management and recommends echocardiogram and formal cardiology consultation in the morning.    SIRS (systemic inflammatory response syndrome) (HCC)   Patient exhibiting leukocytosis tachycardia and tachypnea  No recent history of  symptoms suggestive of underlying infection and therefore it is felt to be unlikely  That being said, considering leukocytosis and pulmonary infiltrates obtaining procalcitonin and CRP as well as blood cultures  Will abstain from antibiotics at this time but if patient develops fever or exhibits other obvious evidence of infection we will reconsider     Hypokalemia   Replacing with intravenous potassium chloride  Magnesium sulfate unremarkable  Monitor potassium levels with serial chemistries    Class 3 severe obesity due to excess calories with serious comorbidity and body mass index (BMI) of 45.0 to 49.9 in adult [facility])   Patient is clinically improved will counsel patient on caloric restriction and regular physical activity.    Hyperglycemia   Notable hyperglycemia initial chemistry  Obtaining hemoglobin A1c   Code Status:  Full code Family Communication: Friend is at bedside who has been updated on plan of care  Status is:  Observation  The patient remains OBS appropriate and will d/c before 2 midnights.  Dispo: The patient is from: Home              Anticipated d/c is to: Home              Patient currently is not medically stable to d/c.   Difficult to place patient No        Marinda Elk MD [facility] Pager [phone]  If 7PM-7AM, please contact night-coverage [email or web address] Use universal [place] password for that web site. If you do not have the password, please call the hospital operator.  09/19/2020, 9:14 PM

## 2020-09-19 NOTE — ED Provider Notes (Signed)
[facility] EMERGENCY DEPARTMENT Provider Note   CSN: [id number] Arrival date & time: 09/19/20  1718     History No chief complaint on file.   Alex Allen is a 40 y.o. male.  Level 5 caveat secondary to respiratory distress.  Per EMS no medical history no medications acute onset of shortness of breath.  Initial sats in the 60s.  Diaphoretic and rales in all lung fields.  No chest pain.  Arrives in respiratory distress.  Denies prior similar episode.  EMS said had some frothy sputum that ended up turning into his pink sputum during transport  The history is provided by the patient and the EMS personnel.  Shortness of Breath Severity:  Severe Onset quality:  Sudden Timing:  Constant Progression:  Worsening Chronicity:  New Relieved by:  Nothing Worsened by:  Nothing Ineffective treatments:  None tried Associated symptoms: cough   Associated symptoms: no abdominal pain, no chest pain, no fever and no sore throat        Past Medical History:  Diagnosis Date  . Hypertension     There are no problems to display for this patient.   History reviewed. No pertinent surgical history.     Family History  Problem Relation Age of Onset  . Thyroid disease Mother   . Diabetes Father   . Hypertension Father   . Hyperlipidemia Father     Social History   Tobacco Use  . Smoking status: Current Some Day Smoker  . Smokeless tobacco: Never Used  Substance Use Topics  . Alcohol use: Yes    Comment: Socially  . Drug use: No    Home Medications Prior to Admission medications   Medication Sig Start Date End Date Taking? Authorizing Provider  gabapentin (NEURONTIN) 100 MG capsule Take 1 capsule (100 mg total) by mouth 3 (three) times daily. 05/08/16   Bing Neighbors, FNP  hydrochlorothiazide (MICROZIDE) 12.5 MG capsule Take 1 capsule (12.5 mg total) by mouth daily. 05/08/16   Bing Neighbors, FNP  losartan (COZAAR) 50 MG tablet Take 1 tablet (50 mg  total) by mouth daily. 05/08/16   Bing Neighbors, FNP  predniSONE (DELTASONE) 20 MG tablet Take 3 PO QAM x3days, 2 PO QAM x3days, 1 PO QAM x3days 05/08/16   Bing Neighbors, FNP    Allergies    Patient has no known allergies.  Review of Systems   Review of Systems  Unable to perform ROS: Acuity of condition  Constitutional: Negative for fever.  HENT: Negative for sore throat.   Respiratory: Positive for cough and shortness of breath.   Cardiovascular: Negative for chest pain.  Gastrointestinal: Negative for abdominal pain.    Physical Exam Updated Vital Signs BP (!) 125/95 (BP Location: Right Arm)   Pulse 95   Temp 97.9 F (36.6 C) (Oral)   Resp 20   Ht 5\' 10"  (1.778 m)   Wt (!) 161.9 kg   SpO2 95%   BMI 51.21 kg/m   Physical Exam Vitals and nursing note reviewed.  Constitutional:      General: He is in acute distress.     Appearance: He is well-developed. He is obese.  HENT:     Head: Normocephalic and atraumatic.  Eyes:     Conjunctiva/sclera: Conjunctivae normal.  Cardiovascular:     Rate and Rhythm: Regular rhythm. Tachycardia present.     Pulses: Normal pulses.     Heart sounds: No murmur heard.   Pulmonary:  Effort: Tachypnea, accessory muscle usage and respiratory distress present.     Breath sounds: Rales present.  Abdominal:     Palpations: Abdomen is soft.     Tenderness: There is no abdominal tenderness.  Musculoskeletal:        General: No deformity or signs of injury. Normal range of motion.     Cervical back: Neck supple.     Right lower leg: No edema.     Left lower leg: No edema.  Skin:    General: Skin is warm and dry.  Neurological:     General: No focal deficit present.     Mental Status: He is alert.     ED Results / Procedures / Treatments   Labs (all labs ordered are listed, but only abnormal results are displayed) Labs Reviewed  BASIC METABOLIC PANEL - Abnormal; Notable for the following components:      Result  Value   Potassium 3.0 (*)    Glucose, Bld 205 (*)    Creatinine, Ser 1.28 (*)    Calcium 8.6 (*)    All other components within normal limits  BRAIN NATRIURETIC PEPTIDE - Abnormal; Notable for the following components:   B Natriuretic Peptide 181.5 (*)    All other components within normal limits  CBC - Abnormal; Notable for the following components:   WBC 16.5 (*)    RBC 5.93 (*)    All other components within normal limits  URINALYSIS, COMPLETE (UACMP) WITH MICROSCOPIC - Abnormal; Notable for the following components:   Hgb urine dipstick SMALL (*)    Protein, ur 100 (*)    Bacteria, UA RARE (*)    All other components within normal limits  C-REACTIVE PROTEIN - Abnormal; Notable for the following components:   CRP 2.7 (*)    All other components within normal limits  D-DIMER, QUANTITATIVE - Abnormal; Notable for the following components:   D-Dimer, Quant 2.81 (*)    All other components within normal limits  HEMOGLOBIN A1C - Abnormal; Notable for the following components:   Hgb A1c MFr Bld 7.1 (*)    All other components within normal limits  COMPREHENSIVE METABOLIC PANEL - Abnormal; Notable for the following components:   Glucose, Bld 130 (*)    Albumin 3.2 (*)    All other components within normal limits  CBC WITH DIFFERENTIAL/PLATELET - Abnormal; Notable for the following components:   WBC 11.3 (*)    All other components within normal limits  HEPARIN LEVEL (UNFRACTIONATED) - Abnormal; Notable for the following components:   Heparin Unfractionated 0.11 (*)    All other components within normal limits  LIPID PANEL - Abnormal; Notable for the following components:   HDL 34 (*)    LDL Cholesterol 111 (*)    All other components within normal limits  I-STAT VENOUS BLOOD GAS, ED - Abnormal; Notable for the following components:   pO2, Ven 50.0 (*)    Bicarbonate 29.9 (*)    Potassium 3.1 (*)    Calcium, Ion 1.03 (*)    HCT 53.0 (*)    Hemoglobin 18.0 (*)    All other  components within normal limits  TROPONIN I (HIGH SENSITIVITY) - Abnormal; Notable for the following components:   Troponin I (High Sensitivity) 56 (*)    All other components within normal limits  TROPONIN I (HIGH SENSITIVITY) - Abnormal; Notable for the following components:   Troponin I (High Sensitivity) 309 (*)    All other components within normal limits  TROPONIN I (HIGH SENSITIVITY) - Abnormal; Notable for the following components:   Troponin I (High Sensitivity) 989 (*)    All other components within normal limits  RESP PANEL BY RT-PCR (FLU A&B, COVID) ARPGX2  CULTURE, BLOOD (ROUTINE X 2)  CULTURE, BLOOD (ROUTINE X 2)  MAGNESIUM  RAPID URINE DRUG SCREEN, HOSP PERFORMED  PROCALCITONIN  HIV ANTIBODY (ROUTINE TESTING W REFLEX)    EKG EKG Interpretation  Date/Time:  Sunday September 19 2020 19:02:37 EDT Ventricular Rate:  131 PR Interval:    QRS Duration: 176 QT Interval:  423 QTC Calculation: 625 R Axis:   -35 Text Interpretation: Sinus tachycardia LAE, consider biatrial enlargement Left bundle branch block Confirmed by Meridee Score [phone]) on 09/19/2020 7:07:38 PM Also confirmed by Meridee Score [phone]), editor Elita Quick ([id number])  on 09/20/2020 7:51:13 AM   Radiology DG Chest Portable 1 View  Result Date: 09/19/2020 CLINICAL DATA:  Shortness of breath, respiratory distress, pain conned frothy sputum, tachycardia, tachypnea EXAM: PORTABLE CHEST 1 VIEW COMPARISON:  Portable exam 1737 hours without priors for comparison FINDINGS: Enlargement of cardiac silhouette with pulmonary vascular congestion. Interstitial infiltrates bilaterally slightly greater on RIGHT which could represent asymmetric pulmonary edema or multifocal infection. No pleural effusion or pneumothorax. Osseous structures unremarkable. IMPRESSION: Enlargement of cardiac silhouette with pulmonary vascular congestion. Asymmetric interstitial infiltrates greater on RIGHT, question asymmetric pulmonary edema  versus infection. Electronically Signed   By: Ulyses Southward M.D.   On: 09/19/2020 17:50    Procedures .Critical Care Performed by: Terrilee Files, MD Authorized by: Terrilee Files, MD   Critical care provider statement:    Critical care time (minutes):  60   Critical care time was exclusive of:  Separately billable procedures and treating other patients   Critical care was necessary to treat or prevent imminent or life-threatening deterioration of the following conditions:  Cardiac failure, respiratory failure and metabolic crisis   Critical care was time spent personally by me on the following activities:  Discussions with consultants, evaluation of patient's response to treatment, examination of patient, ordering and performing treatments and interventions, ordering and review of laboratory studies, ordering and review of radiographic studies, pulse oximetry, re-evaluation of patient's condition, obtaining history from patient or surrogate, review of old charts and development of treatment plan with patient or surrogate     Medications Ordered in ED Medications  sodium chloride flush (NS) 0.9 % injection 3 mL (3 mLs Intravenous Given 09/20/20 0840)  sodium chloride flush (NS) 0.9 % injection 3 mL (has no administration in time range)  0.9 %  sodium chloride infusion (has no administration in time range)  acetaminophen (TYLENOL) tablet 650 mg (has no administration in time range)  ondansetron (ZOFRAN) injection 4 mg (has no administration in time range)  furosemide (LASIX) injection 40 mg (40 mg Intravenous Given 09/20/20 0838)  carvedilol (COREG) tablet 3.125 mg (3.125 mg Oral Given 09/20/20 0838)  aspirin tablet 325 mg (325 mg Oral Given 09/20/20 0838)  heparin ADULT infusion 100 units/mL (25000 units/249mL) (1,250 Units/hr Intravenous Handoff 09/20/20 0724)  albuterol (PROVENTIL) (2.5 MG/3ML) 0.083% nebulizer solution 2.5 mg (has no administration in time range)  lisinopril (ZESTRIL)  tablet 20 mg (20 mg Oral Given 09/20/20 0838)  hydrALAZINE (APRESOLINE) injection 10 mg (has no administration in time range)  MEDLINE mouth rinse (has no administration in time range)  cefTRIAXone (ROCEPHIN) 1 g in sodium chloride 0.9 % 100 mL IVPB (1 g Intravenous New Bag/Given 09/20/20 0844)  azithromycin (ZITHROMAX) 250 mg  in dextrose 5 % 125 mL IVPB (has no administration in time range)  potassium chloride SA (KLOR-CON) CR tablet 40 mEq (has no administration in time range)  furosemide (LASIX) injection 40 mg (40 mg Intravenous Given 09/19/20 1726)  potassium chloride 10 mEq in 100 mL IVPB (0 mEq Intravenous Stopped 09/19/20 2331)  heparin bolus via infusion 4,000 Units (4,000 Units Intravenous Bolus from Bag 09/19/20 2237)    ED Course  I have reviewed the triage vital signs and the nursing notes.  Pertinent labs & imaging results that were available during my care of the patient were reviewed by me and considered in my medical decision making (see chart for details).  Clinical Course as of 09/20/20 1601  Wynelle Link Sep 19, 2020  1745 X-ray interpreted by me as cardiomegaly and signs of CHF. [MB]  1745 Patient on BiPAP and nitro drip.  IV Lasix has been given. [MB]  1829 Reassessment.  Heart rate down to 135.  Saturations good.  No diaphoresis.  Moving better air. [MB]  1913 Discussed with Dr. Molli Hazard cardiology fellow.  He said this could be a medical admission and if the team wants formal consult they can request it.  He would recommend an echo. [MB]  1948 Discussed with [facility] hospitalist Dr. Leafy Half who will evaluate the patient for admission. [MB]    Clinical Course User Index [MB] Terrilee Files, MD   MDM Rules/Calculators/A&P                          This patient complains of shortness of breath chest tightness; this involves an extensive number of treatment Options and is a complaint that carries with it a high risk of complications and Morbidity. The differential includes  hypoxia, ACS, PE, CHF, hypertensive emergency, dissection  I ordered, reviewed and interpreted labs, which included CBC with elevated white count, chemistries normal other than low potassium, troponin elevated will need to be trended, BNP elevated I ordered medication IV Lasix, IV nitroglycerin drip I ordered imaging studies which included chest x-ray and I independently    visualized and interpreted imaging which showed cardiomegaly and pulmonary edema Additional history obtained from EMS Previous records obtained and reviewed in epic, no recent visits I consulted cardiology Dr. Molli Hazard and [facility] hospitalist Dr. Leafy Half and discussed lab and imaging findings  Critical Interventions: Patient's hypertensive emergency respiratory distress hypoxia with aggressive interventions and BiPAP  After the interventions stated above, I reevaluated the patient and found patient's blood pressure to be normalizing and oxygenation better.  He is also breathing more comfortably.  He is agreeable to admission to the hospital for continued management.  Final Clinical Impression(s) / ED Diagnoses Final diagnoses:  Hypertensive emergency  Acute pulmonary edema (HCC)  Hypokalemia    Rx / DC Orders ED Discharge Orders    None       Terrilee Files, MD 09/20/20 [phone]

## 2020-09-20 ENCOUNTER — Encounter (HOSPITAL_COMMUNITY): Payer: Self-pay | Admitting: Internal Medicine

## 2020-09-20 ENCOUNTER — Observation Stay (HOSPITAL_BASED_OUTPATIENT_CLINIC_OR_DEPARTMENT_OTHER): Payer: BC Managed Care – PPO

## 2020-09-20 ENCOUNTER — Other Ambulatory Visit: Payer: Self-pay

## 2020-09-20 DIAGNOSIS — Z6841 Body Mass Index (BMI) 40.0 and over, adult: Secondary | ICD-10-CM | POA: Diagnosis not present

## 2020-09-20 DIAGNOSIS — Z20822 Contact with and (suspected) exposure to covid-19: Secondary | ICD-10-CM | POA: Diagnosis present

## 2020-09-20 DIAGNOSIS — J9601 Acute respiratory failure with hypoxia: Secondary | ICD-10-CM

## 2020-09-20 DIAGNOSIS — E785 Hyperlipidemia, unspecified: Secondary | ICD-10-CM | POA: Diagnosis present

## 2020-09-20 DIAGNOSIS — I110 Hypertensive heart disease with heart failure: Secondary | ICD-10-CM | POA: Diagnosis present

## 2020-09-20 DIAGNOSIS — Z79899 Other long term (current) drug therapy: Secondary | ICD-10-CM | POA: Diagnosis not present

## 2020-09-20 DIAGNOSIS — I161 Hypertensive emergency: Secondary | ICD-10-CM

## 2020-09-20 DIAGNOSIS — I248 Other forms of acute ischemic heart disease: Secondary | ICD-10-CM | POA: Diagnosis present

## 2020-09-20 DIAGNOSIS — E876 Hypokalemia: Secondary | ICD-10-CM

## 2020-09-20 DIAGNOSIS — I5041 Acute combined systolic (congestive) and diastolic (congestive) heart failure: Secondary | ICD-10-CM

## 2020-09-20 DIAGNOSIS — J9600 Acute respiratory failure, unspecified whether with hypoxia or hypercapnia: Secondary | ICD-10-CM | POA: Diagnosis present

## 2020-09-20 DIAGNOSIS — Z83438 Family history of other disorder of lipoprotein metabolism and other lipidemia: Secondary | ICD-10-CM | POA: Diagnosis not present

## 2020-09-20 DIAGNOSIS — I447 Left bundle-branch block, unspecified: Secondary | ICD-10-CM | POA: Diagnosis present

## 2020-09-20 DIAGNOSIS — I214 Non-ST elevation (NSTEMI) myocardial infarction: Secondary | ICD-10-CM

## 2020-09-20 DIAGNOSIS — I428 Other cardiomyopathies: Secondary | ICD-10-CM | POA: Diagnosis present

## 2020-09-20 DIAGNOSIS — I5021 Acute systolic (congestive) heart failure: Secondary | ICD-10-CM

## 2020-09-20 DIAGNOSIS — Z833 Family history of diabetes mellitus: Secondary | ICD-10-CM | POA: Diagnosis not present

## 2020-09-20 DIAGNOSIS — J189 Pneumonia, unspecified organism: Secondary | ICD-10-CM | POA: Diagnosis present

## 2020-09-20 DIAGNOSIS — E1165 Type 2 diabetes mellitus with hyperglycemia: Secondary | ICD-10-CM | POA: Diagnosis present

## 2020-09-20 DIAGNOSIS — I25110 Atherosclerotic heart disease of native coronary artery with unstable angina pectoris: Secondary | ICD-10-CM | POA: Diagnosis present

## 2020-09-20 DIAGNOSIS — F17290 Nicotine dependence, other tobacco product, uncomplicated: Secondary | ICD-10-CM | POA: Diagnosis present

## 2020-09-20 DIAGNOSIS — Z8249 Family history of ischemic heart disease and other diseases of the circulatory system: Secondary | ICD-10-CM | POA: Diagnosis not present

## 2020-09-20 HISTORY — PX: TRANSTHORACIC ECHOCARDIOGRAM: SHX275

## 2020-09-20 LAB — ECHOCARDIOGRAM COMPLETE
AR max vel: 3.77 cm2
AV Area VTI: 3.58 cm2
AV Area mean vel: 3.87 cm2
AV Mean grad: 3 mmHg
AV Peak grad: 5.3 mmHg
Ao pk vel: 1.15 m/s
Calc EF: 34.1 %
Height: 70 in
S' Lateral: 5.3 cm
Single Plane A2C EF: 41.1 %
Single Plane A4C EF: 27.8 %
Weight: 5710.4 oz

## 2020-09-20 LAB — COMPREHENSIVE METABOLIC PANEL
ALT: 31 U/L (ref 0–44)
AST: 40 U/L (ref 15–41)
Albumin: 3.2 g/dL — ABNORMAL LOW (ref 3.5–5.0)
Alkaline Phosphatase: 74 U/L (ref 38–126)
Anion gap: 6 (ref 5–15)
BUN: 9 mg/dL (ref 6–20)
CO2: 29 mmol/L (ref 22–32)
Calcium: 8.9 mg/dL (ref 8.9–10.3)
Chloride: 103 mmol/L (ref 98–111)
Creatinine, Ser: 0.96 mg/dL (ref 0.61–1.24)
GFR, Estimated: >60 mL/min (ref >60)
Glucose, Bld: 130 mg/dL — ABNORMAL HIGH (ref 70–99)
Potassium: 3.6 mmol/L (ref 3.5–5.1)
Sodium: 138 mmol/L (ref 135–145)
Total Bilirubin: 1 mg/dL (ref 0.3–1.2)
Total Protein: 6.8 g/dL (ref 6.5–8.1)

## 2020-09-20 LAB — HEMOGLOBIN A1C
Hgb A1c MFr Bld: 7.1 % — ABNORMAL HIGH (ref 4.8–5.6)
Mean Plasma Glucose: 157.07 mg/dL

## 2020-09-20 LAB — CBC WITH DIFFERENTIAL/PLATELET
Abs Immature Granulocytes: 0.05 10*3/uL (ref 0.00–0.07)
Basophils Absolute: 0 10*3/uL (ref 0.0–0.1)
Basophils Relative: 0 %
Eosinophils Absolute: 0.1 10*3/uL (ref 0.0–0.5)
Eosinophils Relative: 1 %
HCT: 45.5 % (ref 39.0–52.0)
Hemoglobin: 15.4 g/dL (ref 13.0–17.0)
Immature Granulocytes: 0 %
Lymphocytes Relative: 24 %
Lymphs Abs: 2.7 10*3/uL (ref 0.7–4.0)
MCH: 28.7 pg (ref 26.0–34.0)
MCHC: 33.8 g/dL (ref 30.0–36.0)
MCV: 84.7 fL (ref 80.0–100.0)
Monocytes Absolute: 0.9 10*3/uL (ref 0.1–1.0)
Monocytes Relative: 8 %
Neutro Abs: 7.5 10*3/uL (ref 1.7–7.7)
Neutrophils Relative %: 67 %
Platelets: 223 10*3/uL (ref 150–400)
RBC: 5.37 MIL/uL (ref 4.22–5.81)
RDW: 13.8 % (ref 11.5–15.5)
WBC: 11.3 10*3/uL — ABNORMAL HIGH (ref 4.0–10.5)
nRBC: 0 % (ref 0.0–0.2)

## 2020-09-20 LAB — LIPID PANEL
Cholesterol: 160 mg/dL (ref 0–200)
HDL: 34 mg/dL — ABNORMAL LOW (ref >40)
LDL Cholesterol: 111 mg/dL — ABNORMAL HIGH (ref 0–99)
Total CHOL/HDL Ratio: 4.7 RATIO
Triglycerides: 74 mg/dL (ref <150)
VLDL: 15 mg/dL (ref 0–40)

## 2020-09-20 LAB — GLUCOSE, CAPILLARY
Glucose-Capillary: 102 mg/dL — ABNORMAL HIGH (ref 70–99)
Glucose-Capillary: 135 mg/dL — ABNORMAL HIGH (ref 70–99)

## 2020-09-20 LAB — HEPARIN LEVEL (UNFRACTIONATED)
Heparin Unfractionated: 0.11 IU/mL — ABNORMAL LOW (ref 0.30–0.70)
Heparin Unfractionated: <0.1 IU/mL — ABNORMAL LOW (ref 0.30–0.70)

## 2020-09-20 LAB — PROCALCITONIN: Procalcitonin: 3.05 ng/mL

## 2020-09-20 LAB — TROPONIN I (HIGH SENSITIVITY): Troponin I (High Sensitivity): 989 ng/L (ref <18)

## 2020-09-20 LAB — HIV ANTIBODY (ROUTINE TESTING W REFLEX): HIV Screen 4th Generation wRfx: NONREACTIVE

## 2020-09-20 LAB — D-DIMER, QUANTITATIVE: D-Dimer, Quant: 2.81 ug/mL-FEU — ABNORMAL HIGH (ref 0.00–0.50)

## 2020-09-20 LAB — C-REACTIVE PROTEIN: CRP: 2.7 mg/dL — ABNORMAL HIGH (ref <1.0)

## 2020-09-20 MED ORDER — HEPARIN BOLUS VIA INFUSION
2200.0000 [IU] | Freq: Once | INTRAVENOUS | Status: AC
  Administered 2020-09-20: 2200 [IU] via INTRAVENOUS
  Filled 2020-09-20: qty 2200

## 2020-09-20 MED ORDER — LIVING WELL WITH DIABETES BOOK
Freq: Once | Status: AC
  Filled 2020-09-20: qty 1

## 2020-09-20 MED ORDER — ATORVASTATIN CALCIUM 10 MG PO TABS
20.0000 mg | ORAL_TABLET | Freq: Every day | ORAL | Status: DC
  Administered 2020-09-20: 20 mg via ORAL
  Filled 2020-09-20: qty 2

## 2020-09-20 MED ORDER — SODIUM CHLORIDE 0.9% FLUSH: 3.0000 mL | Freq: Two times a day (BID) | INTRAVENOUS | Status: DC

## 2020-09-20 MED ORDER — INSULIN ASPART 100 UNIT/ML ~~LOC~~ SOLN: 0.0000 [IU] | Freq: Three times a day (TID) | SUBCUTANEOUS | Status: DC

## 2020-09-20 MED ORDER — ORAL CARE MOUTH RINSE
15.0000 mL | Freq: Two times a day (BID) | OROMUCOSAL | Status: DC
  Administered 2020-09-20 (×2): 15 mL via OROMUCOSAL

## 2020-09-20 MED ORDER — PERFLUTREN LIPID MICROSPHERE
1.0000 mL | INTRAVENOUS | Status: AC | PRN
  Administered 2020-09-20: 4 mL via INTRAVENOUS
  Filled 2020-09-20: qty 10

## 2020-09-20 MED ORDER — INSULIN ASPART 100 UNIT/ML ~~LOC~~ SOLN: 0.0000 [IU] | Freq: Every day | SUBCUTANEOUS | Status: DC

## 2020-09-20 MED ORDER — DEXTROSE 5 % IV SOLN
250.0000 mg | INTRAVENOUS | Status: DC
  Administered 2020-09-20 – 2020-09-22 (×3): 250 mg via INTRAVENOUS
  Filled 2020-09-20 (×5): qty 250

## 2020-09-20 MED ORDER — HEPARIN BOLUS VIA INFUSION
2000.0000 [IU] | Freq: Once | INTRAVENOUS | Status: AC
  Administered 2020-09-20: 2000 [IU] via INTRAVENOUS
  Filled 2020-09-20: qty 2000

## 2020-09-20 MED ORDER — SACUBITRIL-VALSARTAN 49-51 MG PO TABS
1.0000 | ORAL_TABLET | Freq: Two times a day (BID) | ORAL | Status: DC
  Administered 2020-09-21 – 2020-09-22 (×2): 1 via ORAL
  Filled 2020-09-20 (×3): qty 1

## 2020-09-20 MED ORDER — SODIUM CHLORIDE 0.9 % IV SOLN
1.0000 g | INTRAVENOUS | Status: DC
  Administered 2020-09-20 – 2020-09-22 (×3): 1 g via INTRAVENOUS
  Filled 2020-09-20 (×3): qty 10

## 2020-09-20 MED ORDER — POTASSIUM CHLORIDE CRYS ER 20 MEQ PO TBCR
40.0000 meq | EXTENDED_RELEASE_TABLET | Freq: Once | ORAL | Status: AC
  Administered 2020-09-20: 40 meq via ORAL
  Filled 2020-09-20: qty 2

## 2020-09-20 NOTE — H&P (View-Only) (Signed)
Cardiology Consultation:   Patient ID: Alex Allen MRN: [id number]; DOB: [birth year]  Admit date: 09/19/2020 Date of Consult: 09/20/2020  PCP:  Bing Neighbors, FNP   [facility]  Cardiologist:  New -Dr. Herbie Baltimore Advanced Practice Provider:  No care team member to display Electrophysiologist:  None [id number]    Patient Profile:   Alex Allen is a 40 y.o. male with a hx of  hypertension and morbid obesity with BMI of 51 who is being seen today for the evaluation of hypertensive emergency, acute CHF and elevated troponin at the request of Dr. Alvino Chapel.  History of Present Illness:   Alex Allen is a pleasant 40 year old male with past medical history of hypertension and morbid obesity with BMI of 51 who presented with increasing shortness of breath since Sunday night.  Previously, he was on losartan and hydrochlorothiazide, however his PCP discontinued both medication about a year ago as his blood pressure was good.  Since his PCP is not part of [facility], I am unable to see the report.  Yesterday, he had increasing dyspnea prompted the patient to seek medical attention at [facility]. He has been having intermittent chest pressure lately near the end of the day after he rested some.  He denies any significant chest discomfort during the day when he is walking around and exerting himself.  The chest discomfort is described as a pressure-like sensation that lasts only a few seconds before going away.  On EMS arrival, he had frothy sputum with initial blood pressure of 220.  He refused CPAP therapy and was eventually placed on nonrebreather mask.  On arrival to [facility] ED, he was transitioned to BiPAP therapy due to respiratory distress.  EKG shows sinus tachycardia with left bundle branch block.  Chest x-ray was concerning for acute CHF exacerbation.  Patient was placed on IV Lasix and IV nitroglycerin.  Since then, he has been placed on 20 mg of lisinopril and 3.125 mg  twice a day of carvedilol.  Blood pressure has returned to normal.  Initial high-sensitivity troponin was 56, however this subsequently trended up to 989.  D-dimer was elevated at 2.81.  Fasting lipid panel shows mildly elevated LDL of 111, low HDL.  Hemoglobin A1c was 7.1 which placed the patient in new diabetes.  Echocardiogram has been obtained and is currently pending.  Cardiology service has been consulted for elevated troponin, acute CHF and hypertensive emergency.   Past Medical History:  Diagnosis Date  . Hypertension     History reviewed. No pertinent surgical history.   Home Medications:  Prior to Admission medications   Medication Sig Start Date End Date Taking? Authorizing Provider  gabapentin (NEURONTIN) 100 MG capsule Take 1 capsule (100 mg total) by mouth 3 (three) times daily. 05/08/16   Bing Neighbors, FNP  hydrochlorothiazide (MICROZIDE) 12.5 MG capsule Take 1 capsule (12.5 mg total) by mouth daily. 05/08/16   Bing Neighbors, FNP  losartan (COZAAR) 50 MG tablet Take 1 tablet (50 mg total) by mouth daily. 05/08/16   Bing Neighbors, FNP  predniSONE (DELTASONE) 20 MG tablet Take 3 PO QAM x3days, 2 PO QAM x3days, 1 PO QAM x3days 05/08/16   Bing Neighbors, FNP    Inpatient Medications: Scheduled Meds: . aspirin  325 mg Oral Daily  . carvedilol  3.125 mg Oral BID WC  . furosemide  40 mg Intravenous BID  . insulin aspart  0-5 Units Subcutaneous QHS  . insulin aspart  0-9 Units Subcutaneous TID WC  . lisinopril  20 mg Oral Daily  . mouth rinse  15 mL Mouth Rinse BID  . sodium chloride flush  3 mL Intravenous Q12H   Continuous Infusions: . sodium chloride    . azithromycin 250 mg (09/20/20 1040)  . cefTRIAXone (ROCEPHIN)  IV 1 g (09/20/20 0844)  . heparin 1,600 Units/hr (09/20/20 1128)   PRN Meds: sodium chloride, acetaminophen, albuterol, hydrALAZINE, ondansetron (ZOFRAN) IV, perflutren lipid microspheres (DEFINITY) IV suspension, sodium chloride  flush  Allergies:   No Known Allergies  Social History:   Social History   Socioeconomic History  . Marital status: Single    Spouse name: Not on file  . Number of children: Not on file  . Years of education: Not on file  . Highest education level: Not on file  Occupational History  . Not on file  Tobacco Use  . Smoking status: Current Some Day Smoker    Types: Cigars  . Smokeless tobacco: Never Used  Substance and Sexual Activity  . Alcohol use: Yes    Comment: Socially  . Drug use: No  . Sexual activity: Yes  Other Topics Concern  . Not on file  Social History Narrative  . Not on file   Social Determinants of Health   Financial Resource Strain: Not on file  Food Insecurity: Not on file  Transportation Needs: Not on file  Physical Activity: Not on file  Stress: Not on file  Social Connections: Not on file  Intimate Partner Violence: Not on file    Family History:    Family History  Problem Relation Age of Onset  . Thyroid disease Mother   . Diabetes Father   . Hypertension Father   . Hyperlipidemia Father      ROS:  Please see the history of present illness.   All other ROS reviewed and negative.     Physical Exam/Data:   Vitals:   09/20/20 0224 09/20/20 0524 09/20/20 0623 09/20/20 0824  BP: 100/71 104/77 (!) 125/95 111/78  Pulse: 94 80 95 88  Resp: 20     Temp: 97.8 F (36.6 C)  97.9 F (36.6 C) 98.3 F (36.8 C)  TempSrc: Oral  Oral   SpO2: 93% 92% 95% 99%  Weight: (!) 161.9 kg     Height:        Intake/Output Summary (Last 24 hours) at 09/20/2020 1229 Last data filed at 09/20/2020 1044 Gross per 24 hour  Intake 569.47 ml  Output 1900 ml  Net -1330.53 ml   Last 3 Weights 09/20/2020 09/19/2020 09/19/2020  Weight (lbs) 356 lb 14.4 oz 359 lb 9.6 oz 308 lb 10.3 oz  Weight (kg) 161.889 kg 163.113 kg 140 kg     Body mass index is 51.21 kg/m.  General:  Well nourished, well developed, in no acute distress HEENT: normal Lymph: no  adenopathy Neck: no JVD Endocrine:  No thryomegaly Vascular: No carotid bruits; FA pulses 2+ bilaterally without bruits  Cardiac:  normal S1, S2; RRR; no murmur  Lungs:  clear to auscultation bilaterally, no wheezing, rhonchi or rales  Abd: soft, nontender, no hepatomegaly  Ext: no edema Musculoskeletal:  No deformities, BUE and BLE strength normal and equal Skin: warm and dry  Neuro:  CNs 2-12 intact, no focal abnormalities noted Psych:  Normal affect   EKG:  The EKG was personally reviewed and demonstrates: Sinus tachycardia with left bundle branch block. Telemetry:  Telemetry was personally reviewed and demonstrates: Sinus rhythm,   left bundle branch block, heart rate better controlled  Relevant CV Studies:  Pending echo  Laboratory Data:  High Sensitivity Troponin:   Recent Labs  Lab 09/19/20 1723 09/19/20 1924 09/20/20 0001  TROPONINIHS 56* 309* 989*     Chemistry Recent Labs  Lab 09/19/20 1723 09/19/20 1737 09/20/20 0701  NA 136 139 138  K 3.0* 3.1* 3.6  CL 99  --  103  CO2 27  --  29  GLUCOSE 205*  --  130*  BUN 9  --  9  CREATININE 1.28*  --  0.96  CALCIUM 8.6*  --  8.9  GFRNONAA >60  --  >60  ANIONGAP 10  --  6    Recent Labs  Lab 09/20/20 0701  PROT 6.8  ALBUMIN 3.2*  AST 40  ALT 31  ALKPHOS 74  BILITOT 1.0   Hematology Recent Labs  Lab 09/19/20 1723 09/19/20 1737 09/20/20 0701  WBC 16.5*  --  11.3*  RBC 5.93*  --  5.37  HGB 17.0 18.0* 15.4  HCT 50.9 53.0* 45.5  MCV 85.8  --  84.7  MCH 28.7  --  28.7  MCHC 33.4  --  33.8  RDW 13.6  --  13.8  PLT 271  --  223   BNP Recent Labs  Lab 09/19/20 1723  BNP 181.5*    DDimer  Recent Labs  Lab 09/19/20 2338  DDIMER 2.81*     Radiology/Studies:  DG Chest Portable 1 View  Result Date: 09/19/2020 CLINICAL DATA:  Shortness of breath, respiratory distress, pain conned frothy sputum, tachycardia, tachypnea EXAM: PORTABLE CHEST 1 VIEW COMPARISON:  Portable exam 1737 hours without  priors for comparison FINDINGS: Enlargement of cardiac silhouette with pulmonary vascular congestion. Interstitial infiltrates bilaterally slightly greater on RIGHT which could represent asymmetric pulmonary edema or multifocal infection. No pleural effusion or pneumothorax. Osseous structures unremarkable. IMPRESSION: Enlargement of cardiac silhouette with pulmonary vascular congestion. Asymmetric interstitial infiltrates greater on RIGHT, question asymmetric pulmonary edema versus infection. Electronically Signed   By: Ulyses Southward M.D.   On: 09/19/2020 17:50   Cardiology Studies:   TTE 09/20/2020: EF~30%.  Moderate to severely reduced function.  Regional wall motion abnormality: Basal to mid inferior and inferoseptal AK.  Anteroseptal severe HK.  Mild dilation.  Moderate LVH.  Unable to assess diastolic parameters.  Also unable to assess PA pressures, but RV systolic function appears normal.  LA is mildly dilated.  No significant valvular lesions.  Severely dilated IVC suggestive of elevated RAP/CVP 15 mmHg.   Assessment and Plan:   1. Hypertensive emergency  -Per patient, he was taken off of 50 mg losartan and 12.5 mg daily of hydrochlorothiazide a year ago by his PCP  -On arrival to the ED yesterday, systolic blood pressure was over 200.  He does not typically check his blood pressure at home  -Since then, he has been placed on 3.125 mg twice daily of carvedilol and 20 mg daily of lisinopril.  Blood pressure is now in the 120s range.  Acute Combined Systolic and Diastolic CHF: Preliminary review of echocardiogram suggest EF with left 25 to 30% with global hypokinesis, but may be more in the inferoseptal and apical wall.  Appears to have relatively significant diastolic filling parameters as well.   Unfortunate, the overall images were poor.  -Likely driven by hypertensive urgency.  -Receiving 40 mg twice daily of IV Lasix at this time. I/O -1.3 L since admission.  -Pending echocardiogram to  assess ejection  fraction  -Continue carvedilol 3.125 mg daily,  With known reduced EF, will convert from lisinopril to Entresto starting tomorrow evening.  -Near euvolemic level based on exam.   We will plan right and left heart cath tomorrow; we can determine need for additional diuresis based on cath results.  2. NSTEMI versus Demand Ischemia from Accelerated Hypertension: Serial troponin 56 --> 309 --> 989.  Patient does report intermittent chest discomfort, however chest discomfort does not correlate with the degree of exertion and symptom only occurs for a few seconds at a time.  Most likely related to hypertensive emergency, however given the uptrending pattern, will check with MD regarding ischemic work-up.  Unsure if this is ACS mediated versus hypertension/hypertensive heart disease mediated troponin elevation, however he does have regional wall motion normalities on echo suggesting possible ACS.  Plan right and left heart cath tomorrow.  3. Elevated D-dimer: D-dimer 2.1.  Consider CTA versus VQ scan => would consider using VQ scan as opposed to CTA since we are planning cardiac catheterization.  4. Hyperlipidemia: Add low-dose statin medication.  5. Newly diagnosed diabetes mellitus type 2: Hemoglobin A1c 7.1. Per primary team  6. LBBB: No prior EKG to compare.   Risk Assessment/Risk Scores:     TIMI Risk Score for Unstable Angina or Non-ST Elevation MI:   The patient's TIMI risk score is 2, which indicates a 8% risk of all cause mortality, new or recurrent myocardial infarction or need for urgent revascularization in the next 14 days.  New York Heart Association (NYHA) Functional Class NYHA Class III   Signed, Azalee Course, [place]  09/20/2020 12:29 PM   ATTENDING ATTESTATION  I have seen, examined and evaluated the patient this PM along with Azalee Course, PA-C.  After reviewing all the available data and chart, we discussed the patients laboratory, study & physical findings as  well as symptoms in detail. I agree with his findings, examination as well as impression recommendations as per our discussion.    Attending adjustments noted in italics.   Mr. Sausedo is a morbidly obese 40 year old gentleman with history of hypertension, no longer on antihypertensives.  He presented with worsening sensations of chest tightness and dyspnea.  Found to have accelerated hypertension/hypertensive emergency with acute CHF.  Now with echo evaluation showing reduced EF of 25 to 30%, this is now acute combined systolic and diastolic heart failure.     Given the severely reduced EF, segmental wall motion normality and chest tightness as one of the symptoms along with positive troponin, patient warrants right and left heart cath for ischemic evaluation also to determine volume status and cardiac output/index.  Convert from lisinopril to [facility]; continue carvedilol; consider spironolactone prior to discharge.    Bryan Lemma, M.D., M.S. Interventional Cardiologist   Pager # [phone] Phone # [phone] [address] [place], [place] [address]      For questions or updates, please contact [facility] Please consult [email or web address] for contact info under

## 2020-09-20 NOTE — TOC Benefit Eligibility Note (Signed)
Patient Product/process development scientist completed.    The patient is currently uninsured.  [name], CPhT Pharmacy Patient Advocate Specialist [place] [facility] Stewardship Team Direct Number: [phone]  Fax: [phone]

## 2020-09-20 NOTE — Progress Notes (Signed)
ANTICOAGULATION CONSULT NOTE  Pharmacy Consult for Heparin Indication: chest pain/ACS  No Known Allergies  Patient Measurements: Height: 5\' 10"  (177.8 cm) Weight: (!) 161.9 kg (356 lb 14.4 oz) IBW/kg (Calculated) : 73 Heparin Dosing Weight: 101.8 kg  Vital Signs: Temp: 97.9 F (36.6 C) (03/28 0623) Temp Source: Oral (03/28 0623) BP: 125/95 (03/28 0623) Pulse Rate: 95 (03/28 0623)  Labs: Recent Labs    09/19/20 1723 09/19/20 1737 09/19/20 1924 09/20/20 0001 09/20/20 0701  HGB 17.0 18.0*  --   --  15.4  HCT 50.9 53.0*  --   --  45.5  PLT 271  --   --   --  223  HEPARINUNFRC  --   --   --   --  0.11*  CREATININE 1.28*  --   --   --  0.96  TROPONINIHS 56*  --  309* 989*  --     Estimated Creatinine Clearance: 155.5 mL/min (by C-G formula based on SCr of 0.96 mg/dL).   Medical History: Past Medical History:  Diagnosis Date  . Hypertension     Medications:  Scheduled:  . aspirin  325 mg Oral Daily  . carvedilol  3.125 mg Oral BID WC  . furosemide  40 mg Intravenous BID  . lisinopril  20 mg Oral Daily  . mouth rinse  15 mL Mouth Rinse BID  . sodium chloride flush  3 mL Intravenous Q12H    Assessment: Patient is a 23 yom that is being admitted for SOB/ACS. Patient had slightly elevated trops on first collection however this rose on repeat labs. Pharmacy has been asked to dose heparin at this time for ACS.   Initial heparin level low at 0.11 on 1250 units/hr. No bleeding or infusion issues noted. Hgb down from 18 on admit to 15.4 this morning. Plt count wnl.   Goal of Therapy:  Heparin level 0.3-0.7 units/ml Monitor platelets by anticoagulation protocol: Yes   Plan:  - Heparin bolus 2000 units IV x 1 dose - Increase heparin to 1600 units/hr - Heparin level in ~ 6 hours  - Monitor patient for s/s of bleeding and CBC while on heparin   46 PharmD., BCPS Clinical Pharmacist 09/20/2020 10:59 AM

## 2020-09-20 NOTE — Progress Notes (Addendum)
Inpatient Diabetes Program Recommendations  AACE/ADA: New Consensus Statement on Inpatient Glycemic Control (2015)  Target Ranges:  Prepandial:   less than 140 mg/dL      Peak postprandial:   less than 180 mg/dL (1-2 hours)      Critically ill patients:  140 - 180 mg/dL   Lab Results  Component Value Date   HGBA1C 7.1 (H) 09/19/2020    Diabetes history:  New DM  Current orders for Inpatient glycemic control:  Novolog 0-9 units TID and 0-5 units QHS  Inpatient diabetes referral for new DM2.  Spoke with pt about new diagnosis over the phone as this DM coordinator is working remotely today. Discussed A1C results with him and explained what an A1C is, basic pathophysiology of DM Type 2, basic home care, basic diabetes diet nutrition principles, importance of checking CBGs and maintaining good CBG control to prevent long-term and short-term complications. Reviewed signs and symptoms of hyperglycemia and hypoglycemia and how to treat hypoglycemia at home. Also reviewed blood sugar goals at home.  RNs to provide ongoing basic DM education at bedside with this patient. Have ordered diabetes educational booklet. Have also placed RD consult for DM diet education for this patient.  He states he has a history of diabetes on both sides of his family.  He drinks sodas and sweet tea and enjoys bread, rice and pasta.  Educated him on CHO's and importance of serving sizes; we discussed The Plate Method as well.  Educated importance of physical activity.  He will need close follow up with PCP every 3-6 mos.   He would like options of local PCP's as he would like to change from his current PCP.  Placed TOC consult for assistance with this.    Discussed Metformin, how it works, side effects and to take with meals as he will likely discharge on an oral agent.  He will need to check his blood sugar daily and bring meter to PCP appointment.  No insurance currently.  Will provide patient with a glucometer.    Will  continue to follow while inpatient.  Thank you, Dulce Sellar, RN, BSN Diabetes Coordinator Inpatient Diabetes Program [phone] (team pager from 8a-5p)

## 2020-09-20 NOTE — Progress Notes (Signed)
  Echocardiogram 2D Echocardiogram has been performed with [facility].  Gerda Diss 09/20/2020, 11:31 AM

## 2020-09-20 NOTE — Consult Note (Addendum)
Cardiology Consultation:   Patient ID: Alex Allen MRN: [id number]; DOB: [birth year]  Admit date: 09/19/2020 Date of Consult: 09/20/2020  PCP:  Alex Neighbors, FNP   [facility]  Cardiologist:  New -Dr. Herbie Baltimore Advanced Practice Provider:  No care team member to display Electrophysiologist:  None [id number]    Patient Profile:   Alex Allen is a 40 y.o. male with a hx of  hypertension and morbid obesity with BMI of 51 who is being seen today for the evaluation of hypertensive emergency, acute CHF and elevated troponin at the request of Dr. Alvino Chapel.  History of Present Illness:   Alex Allen is a pleasant 40 year old male with past medical history of hypertension and morbid obesity with BMI of 51 who presented with increasing shortness of breath since Sunday night.  Previously, he was on losartan and hydrochlorothiazide, however his PCP discontinued both medication about a year ago as his blood pressure was good.  Since his PCP is not part of [facility], I am unable to see the report.  Yesterday, he had increasing dyspnea prompted the patient to seek medical attention at [facility]. He has been having intermittent chest pressure lately near the end of the day after he rested some.  He denies any significant chest discomfort during the day when he is walking around and exerting himself.  The chest discomfort is described as a pressure-like sensation that lasts only a few seconds before going away.  On EMS arrival, he had frothy sputum with initial blood pressure of 220.  He refused CPAP therapy and was eventually placed on nonrebreather mask.  On arrival to [facility] ED, he was transitioned to BiPAP therapy due to respiratory distress.  EKG shows sinus tachycardia with left bundle branch block.  Chest x-ray was concerning for acute CHF exacerbation.  Patient was placed on IV Lasix and IV nitroglycerin.  Since then, he has been placed on 20 mg of lisinopril and 3.125 mg  twice a day of carvedilol.  Blood pressure has returned to normal.  Initial high-sensitivity troponin was 56, however this subsequently trended up to 989.  D-dimer was elevated at 2.81.  Fasting lipid panel shows mildly elevated LDL of 111, low HDL.  Hemoglobin A1c was 7.1 which placed the patient in new diabetes.  Echocardiogram has been obtained and is currently pending.  Cardiology service has been consulted for elevated troponin, acute CHF and hypertensive emergency.   Past Medical History:  Diagnosis Date  . Hypertension     History reviewed. No pertinent surgical history.   Home Medications:  Prior to Admission medications   Medication Sig Start Date End Date Taking? Authorizing Provider  gabapentin (NEURONTIN) 100 MG capsule Take 1 capsule (100 mg total) by mouth 3 (three) times daily. 05/08/16   Alex Neighbors, FNP  hydrochlorothiazide (MICROZIDE) 12.5 MG capsule Take 1 capsule (12.5 mg total) by mouth daily. 05/08/16   Alex Neighbors, FNP  losartan (COZAAR) 50 MG tablet Take 1 tablet (50 mg total) by mouth daily. 05/08/16   Alex Neighbors, FNP  predniSONE (DELTASONE) 20 MG tablet Take 3 PO QAM x3days, 2 PO QAM x3days, 1 PO QAM x3days 05/08/16   Alex Neighbors, FNP    Inpatient Medications: Scheduled Meds: . aspirin  325 mg Oral Daily  . carvedilol  3.125 mg Oral BID WC  . furosemide  40 mg Intravenous BID  . insulin aspart  0-5 Units Subcutaneous QHS  . insulin aspart  0-9 Units Subcutaneous TID WC  . lisinopril  20 mg Oral Daily  . mouth rinse  15 mL Mouth Rinse BID  . sodium chloride flush  3 mL Intravenous Q12H   Continuous Infusions: . sodium chloride    . azithromycin 250 mg (09/20/20 1040)  . cefTRIAXone (ROCEPHIN)  IV 1 g (09/20/20 0844)  . heparin 1,600 Units/hr (09/20/20 1128)   PRN Meds: sodium chloride, acetaminophen, albuterol, hydrALAZINE, ondansetron (ZOFRAN) IV, perflutren lipid microspheres (DEFINITY) IV suspension, sodium chloride  flush  Allergies:   No Known Allergies  Social History:   Social History   Socioeconomic History  . Marital status: Single    Spouse name: Not on file  . Number of children: Not on file  . Years of education: Not on file  . Highest education level: Not on file  Occupational History  . Not on file  Tobacco Use  . Smoking status: Current Some Day Smoker    Types: Cigars  . Smokeless tobacco: Never Used  Substance and Sexual Activity  . Alcohol use: Yes    Comment: Socially  . Drug use: No  . Sexual activity: Yes  Other Topics Concern  . Not on file  Social History Narrative  . Not on file   Social Determinants of Health   Financial Resource Strain: Not on file  Food Insecurity: Not on file  Transportation Needs: Not on file  Physical Activity: Not on file  Stress: Not on file  Social Connections: Not on file  Intimate Partner Violence: Not on file    Family History:    Family History  Problem Relation Age of Onset  . Thyroid disease Mother   . Diabetes Father   . Hypertension Father   . Hyperlipidemia Father      ROS:  Please see the history of present illness.   All other ROS reviewed and negative.     Physical Exam/Data:   Vitals:   09/20/20 0224 09/20/20 0524 09/20/20 0623 09/20/20 0824  BP: 100/71 104/77 (!) 125/95 111/78  Pulse: 94 80 95 88  Resp: 20     Temp: 97.8 F (36.6 C)  97.9 F (36.6 C) 98.3 F (36.8 C)  TempSrc: Oral  Oral   SpO2: 93% 92% 95% 99%  Weight: (!) 161.9 kg     Height:        Intake/Output Summary (Last 24 hours) at 09/20/2020 1229 Last data filed at 09/20/2020 1044 Gross per 24 hour  Intake 569.47 ml  Output 1900 ml  Net -1330.53 ml   Last 3 Weights 09/20/2020 09/19/2020 09/19/2020  Weight (lbs) 356 lb 14.4 oz 359 lb 9.6 oz 308 lb 10.3 oz  Weight (kg) 161.889 kg 163.113 kg 140 kg     Body mass index is 51.21 kg/m.  General:  Well nourished, well developed, in no acute distress HEENT: normal Lymph: no  adenopathy Neck: no JVD Endocrine:  No thryomegaly Vascular: No carotid bruits; FA pulses 2+ bilaterally without bruits  Cardiac:  normal S1, S2; RRR; no murmur  Lungs:  clear to auscultation bilaterally, no wheezing, rhonchi or rales  Abd: soft, nontender, no hepatomegaly  Ext: no edema Musculoskeletal:  No deformities, BUE and BLE strength normal and equal Skin: warm and dry  Neuro:  CNs 2-12 intact, no focal abnormalities noted Psych:  Normal affect   EKG:  The EKG was personally reviewed and demonstrates: Sinus tachycardia with left bundle branch block. Telemetry:  Telemetry was personally reviewed and demonstrates: Sinus rhythm,  left bundle branch block, heart rate better controlled  Relevant CV Studies:  Pending echo  Laboratory Data:  High Sensitivity Troponin:   Recent Labs  Lab 09/19/20 1723 09/19/20 1924 09/20/20 0001  TROPONINIHS 56* 309* 989*     Chemistry Recent Labs  Lab 09/19/20 1723 09/19/20 1737 09/20/20 0701  NA 136 139 138  K 3.0* 3.1* 3.6  CL 99  --  103  CO2 27  --  29  GLUCOSE 205*  --  130*  BUN 9  --  9  CREATININE 1.28*  --  0.96  CALCIUM 8.6*  --  8.9  GFRNONAA >60  --  >60  ANIONGAP 10  --  6    Recent Labs  Lab 09/20/20 0701  PROT 6.8  ALBUMIN 3.2*  AST 40  ALT 31  ALKPHOS 74  BILITOT 1.0   Hematology Recent Labs  Lab 09/19/20 1723 09/19/20 1737 09/20/20 0701  WBC 16.5*  --  11.3*  RBC 5.93*  --  5.37  HGB 17.0 18.0* 15.4  HCT 50.9 53.0* 45.5  MCV 85.8  --  84.7  MCH 28.7  --  28.7  MCHC 33.4  --  33.8  RDW 13.6  --  13.8  PLT 271  --  223   BNP Recent Labs  Lab 09/19/20 1723  BNP 181.5*    DDimer  Recent Labs  Lab 09/19/20 2338  DDIMER 2.81*     Radiology/Studies:  DG Chest Portable 1 View  Result Date: 09/19/2020 CLINICAL DATA:  Shortness of breath, respiratory distress, pain conned frothy sputum, tachycardia, tachypnea EXAM: PORTABLE CHEST 1 VIEW COMPARISON:  Portable exam 1737 hours without  priors for comparison FINDINGS: Enlargement of cardiac silhouette with pulmonary vascular congestion. Interstitial infiltrates bilaterally slightly greater on RIGHT which could represent asymmetric pulmonary edema or multifocal infection. No pleural effusion or pneumothorax. Osseous structures unremarkable. IMPRESSION: Enlargement of cardiac silhouette with pulmonary vascular congestion. Asymmetric interstitial infiltrates greater on RIGHT, question asymmetric pulmonary edema versus infection. Electronically Signed   By: Ulyses Southward M.D.   On: 09/19/2020 17:50   Cardiology Studies:   TTE 09/20/2020: EF~30%.  Moderate to severely reduced function.  Regional wall motion abnormality: Basal to mid inferior and inferoseptal AK.  Anteroseptal severe HK.  Mild dilation.  Moderate LVH.  Unable to assess diastolic parameters.  Also unable to assess PA pressures, but RV systolic function appears normal.  LA is mildly dilated.  No significant valvular lesions.  Severely dilated IVC suggestive of elevated RAP/CVP 15 mmHg.   Assessment and Plan:   1. Hypertensive emergency  -Per patient, he was taken off of 50 mg losartan and 12.5 mg daily of hydrochlorothiazide a year ago by his PCP  -On arrival to the ED yesterday, systolic blood pressure was over 200.  He does not typically check his blood pressure at home  -Since then, he has been placed on 3.125 mg twice daily of carvedilol and 20 mg daily of lisinopril.  Blood pressure is now in the 120s range.  Acute Combined Systolic and Diastolic CHF: Preliminary review of echocardiogram suggest EF with left 25 to 30% with global hypokinesis, but may be more in the inferoseptal and apical wall.  Appears to have relatively significant diastolic filling parameters as well.   Unfortunate, the overall images were poor.  -Likely driven by hypertensive urgency.  -Receiving 40 mg twice daily of IV Lasix at this time. I/O -1.3 L since admission.  -Pending echocardiogram to  assess ejection  fraction  -Continue carvedilol 3.125 mg daily,  With known reduced EF, will convert from lisinopril to Entresto starting tomorrow evening.  -Near euvolemic level based on exam.   We will plan right and left heart cath tomorrow; we can determine need for additional diuresis based on cath results.  2. NSTEMI versus Demand Ischemia from Accelerated Hypertension: Serial troponin 56 --> 309 --> 989.  Patient does report intermittent chest discomfort, however chest discomfort does not correlate with the degree of exertion and symptom only occurs for a few seconds at a time.  Most likely related to hypertensive emergency, however given the uptrending pattern, will check with MD regarding ischemic work-up.  Unsure if this is ACS mediated versus hypertension/hypertensive heart disease mediated troponin elevation, however he does have regional wall motion normalities on echo suggesting possible ACS.  Plan right and left heart cath tomorrow.  3. Elevated D-dimer: D-dimer 2.1.  Consider CTA versus VQ scan => would consider using VQ scan as opposed to CTA since we are planning cardiac catheterization.  4. Hyperlipidemia: Add low-dose statin medication.  5. Newly diagnosed diabetes mellitus type 2: Hemoglobin A1c 7.1. Per primary team  6. LBBB: No prior EKG to compare.   Risk Assessment/Risk Scores:     TIMI Risk Score for Unstable Angina or Non-ST Elevation MI:   The patient's TIMI risk score is 2, which indicates a 8% risk of all cause mortality, new or recurrent myocardial infarction or need for urgent revascularization in the next 14 days.  New York Heart Association (NYHA) Functional Class NYHA Class III   Signed, Azalee Course, [place]  09/20/2020 12:29 PM   ATTENDING ATTESTATION  I have seen, examined and evaluated the patient this PM along with Azalee Course, PA-C.  After reviewing all the available data and chart, we discussed the patients laboratory, study & physical findings as  well as symptoms in detail. I agree with his findings, examination as well as impression recommendations as per our discussion.    Attending adjustments noted in italics.   Mr. Sausedo is a morbidly obese 40 year old gentleman with history of hypertension, no longer on antihypertensives.  He presented with worsening sensations of chest tightness and dyspnea.  Found to have accelerated hypertension/hypertensive emergency with acute CHF.  Now with echo evaluation showing reduced EF of 25 to 30%, this is now acute combined systolic and diastolic heart failure.     Given the severely reduced EF, segmental wall motion normality and chest tightness as one of the symptoms along with positive troponin, patient warrants right and left heart cath for ischemic evaluation also to determine volume status and cardiac output/index.  Convert from lisinopril to [facility]; continue carvedilol; consider spironolactone prior to discharge.    Bryan Lemma, M.D., M.S. Interventional Cardiologist   Pager # [phone] Phone # [phone] [address] [place], [place] [address]      For questions or updates, please contact [facility] Please consult [email or web address] for contact info under

## 2020-09-20 NOTE — Progress Notes (Signed)
PROGRESS NOTE    [name]  WUX:[id number] DOB: 07-29-1980 DOA: 09/19/2020 PCP: Bing Neighbors, FNP     Brief Narrative:  Alex Allen is a 40 year old male with past medical history of hypertension (states he was taken off antihypertensives about a year ago), obesity who presents to [facility] emergency department via EMS with sudden onset shortness of breath. Patient explains that for the past several weeks he has been experiencing intermittent episodes of chest pressure.  Patient describes these episodes of chest pressure as consistently occurring with exertion and consistently resolving with rest. Today he was lying on his couch watching TV when he suddenly experienced severe shortness of breath with associated chest tightness.  Onset was sudden and severe EMS was contacted who promptly arrived to evaluate the patient. According to EMS, patient was covered in frothy sputum with initial blood pressures as high as 220 systolic with oxygen saturations as low as the upper 60s.  Patient refused CPAP and therefore was placed on nonrebreather mask and was promptly brought to [facility] emergency department for evaluation.  Upon evaluation in the emergency department, patient was found to be in respiratory distress and was transitioned to BiPAP therapy.  Patient was administered 40 mg intravenous Lasix.  Patient was continued to exhibit blood pressures as high as 230/130 and therefore was initiated on a nitroglycerin drip for blood pressure control.  Chest x-ray revealed bilateral patchy traits concerning for acute pulmonary edema.  EKG revealed wide complex tachycardia which was reviewed with Dr. Lysle Morales with cardiology.  He was felt that EKG was secondary to a rate related left bundle branch block.  The hospitalist group was then called to assess the patient for admission to the hospital.   New events last 24 hours / Subjective: Patient states he is feeling much better. He  has been deescalated to [place] O2 and off nitro gtt. He states that he was having some "jiggling" in his chest which has now resolved. Felt chest congestion but could not cough anything out. Denies any swelling. He denies fevers, nausea, vomiting. He is in good spirits this morning. He denies any known family history of heart disease.   Assessment & Plan:   Principal Problem:   Acute respiratory failure with hypoxia (HCC) Active Problems:   Hypertensive emergency   Hypokalemia   Class 3 severe obesity due to excess calories with serious comorbidity and body mass index (BMI) of 45.0 to 49.9 in adult [facility])   Hyperglycemia   NSTEMI (non-ST elevated myocardial infarction) (HCC)   SIRS (systemic inflammatory response syndrome) (HCC)   Acute hypoxemic respiratory failure -Initially found with SpO2 in the 60s by EMS. Required BiPAP in the ED which has now been deescalated to [place] O2  -Chest x-ray revealed enlargement of cardiac silhouette, pulmonary vascular congestion, asymmetric interstitial infiltrate questioning asymmetric pulmonary edema versus infection -IV Lasix, echocardiogram pending  Hypertensive emergency with respiratory failure -Now weaned off nitro gtt  -Patient reports that he has been taken off of his home antihypertensives for the past year -Continue Lasix, Coreg, lisinopril  NSTEMI -Troponin peak of 989 -Continue IV heparin, aspirin -Cardiology consulted -Echocardiogram pending  Elevated D-dimer -D-dimer of 2.81 -PE clinical probability score is 1.5, low probability of 4% -Patient is on IV heparin as above regardless -We will consider CTA chest to rule out PE   Sepsis secondary to CAP -Patient admits to cough, presented with leukocytosis, tachycardia, tachypnea as well as elevated procalcitonin -Continue Rocephin, azithromycin x5  days  Diabetes mellitus -Hemoglobin A1c 7.1 -Consult diabetic coordinator for new diagnosis of diabetes -Sliding scale insulin, he will  likely need oral agent for discharge home  Hypokalemia -Replace, trend  Morbid obesity -Estimated body mass index is 51.21 kg/m as calculated from the following:   Height as of this encounter: 5\' 10"  (1.778 m).   Weight as of this encounter: 161.9 kg.     DVT prophylaxis: IV heparin   Code Status: Full code Family Communication: Mother at bedside Disposition Plan:  Status is: Observation  The patient will require care spanning > 2 midnights and should be moved to inpatient because: Ongoing diagnostic testing needed not appropriate for outpatient work up, IV treatments appropriate due to intensity of illness or inability to take PO and Inpatient level of care appropriate due to severity of illness  Dispo: The patient is from: Home              Anticipated d/c is to: Home              Patient currently is not medically stable to d/c.  Remains on IV heparin, IV Lasix, IV antibiotics.  Cardiology consulted today   Difficult to place patient No      Consultants:   Cardiology  Procedures:   None  Antimicrobials:  Anti-infectives (From admission, onward)   Start     Dose/Rate Route Frequency Ordered Stop   09/20/20 0800  cefTRIAXone (ROCEPHIN) 1 g in sodium chloride 0.9 % 100 mL IVPB        1 g 200 mL/hr over 30 Minutes Intravenous Every 24 hours 09/20/20 0733 09/25/20 0759   09/20/20 0800  azithromycin (ZITHROMAX) 250 mg in dextrose 5 % 125 mL IVPB        250 mg 125 mL/hr over 60 Minutes Intravenous Every 24 hours 09/20/20 0733 09/25/20 0759        Objective: Vitals:   09/20/20 0023 09/20/20 0224 09/20/20 0524 09/20/20 0623  BP: (!) 138/112 100/71 104/77 (!) 125/95  Pulse: 99 94 80 95  Resp:  20    Temp: (!) 97.5 F (36.4 C) 97.8 F (36.6 C)  97.9 F (36.6 C)  TempSrc: Oral Oral  Oral  SpO2: 92% 93% 92% 95%  Weight:  (!) 161.9 kg    Height:        Intake/Output Summary (Last 24 hours) at 09/20/2020 0958 Last data filed at 09/20/2020 09/22/2020 Gross per 24  hour  Intake 349.47 ml  Output 1700 ml  Net -1350.53 ml   Filed Weights   09/19/20 1740 09/19/20 2219 09/20/20 0224  Weight: (!) 140 kg (!) 163.1 kg (!) 161.9 kg    Examination:  General exam: Appears calm and comfortable  Respiratory system: Clear to auscultation. Respiratory effort normal. No respiratory distress. No conversational dyspnea.  On 4 L nasal cannula O2 Cardiovascular system: S1 & S2 heard, RRR. No murmurs.  Trace pedal edema. Gastrointestinal system: Abdomen is nondistended, soft and nontender. Normal bowel sounds heard. Central nervous system: Alert and oriented. No focal neurological deficits. Speech clear.  Extremities: Symmetric in appearance  Skin: No rashes, lesions or ulcers on exposed skin  Psychiatry: Judgement and insight appear normal. Mood & affect appropriate.   Data Reviewed: I have personally reviewed following labs and imaging studies  CBC: Recent Labs  Lab 09/19/20 1723 09/19/20 1737 09/20/20 0701  WBC 16.5*  --  11.3*  NEUTROABS  --   --  7.5  HGB 17.0 18.0* 15.4  HCT 50.9 53.0* 45.5  MCV 85.8  --  84.7  PLT 271  --  223   Basic Metabolic Panel: Recent Labs  Lab 09/19/20 1723 09/19/20 1737 09/20/20 0701  NA 136 139 138  K 3.0* 3.1* 3.6  CL 99  --  103  CO2 27  --  29  GLUCOSE 205*  --  130*  BUN 9  --  9  CREATININE 1.28*  --  0.96  CALCIUM 8.6*  --  8.9  MG 2.0  --   --    GFR: Estimated Creatinine Clearance: 155.5 mL/min (by C-G formula based on SCr of 0.96 mg/dL). Liver Function Tests: Recent Labs  Lab 09/20/20 0701  AST 40  ALT 31  ALKPHOS 74  BILITOT 1.0  PROT 6.8  ALBUMIN 3.2*   No results for input(s): LIPASE, AMYLASE in the last 168 hours. No results for input(s): AMMONIA in the last 168 hours. Coagulation Profile: No results for input(s): INR, PROTIME in the last 168 hours. Cardiac Enzymes: No results for input(s): CKTOTAL, CKMB, CKMBINDEX, TROPONINI in the last 168 hours. BNP (last 3 results) No results  for input(s): PROBNP in the last 8760 hours. HbA1C: Recent Labs    09/19/20 2338  HGBA1C 7.1*   CBG: No results for input(s): GLUCAP in the last 168 hours. Lipid Profile: Recent Labs    09/20/20 0701  CHOL 160  HDL 34*  LDLCALC 111*  TRIG 74  CHOLHDL 4.7   Thyroid Function Tests: No results for input(s): TSH, T4TOTAL, FREET4, T3FREE, THYROIDAB in the last 72 hours. Anemia Panel: No results for input(s): VITAMINB12, FOLATE, FERRITIN, TIBC, IRON, RETICCTPCT in the last 72 hours. Sepsis Labs: Recent Labs  Lab 09/19/20 2338  PROCALCITON 3.05    Recent Results (from the past 240 hour(s))  Resp Panel by RT-PCR (Flu A&B, Covid) Nasopharyngeal Swab     Status: None   Collection Time: 09/19/20  5:25 PM   Specimen: Nasopharyngeal Swab; Nasopharyngeal(NP) swabs in vial transport medium  Result Value Ref Range Status   SARS Coronavirus 2 by RT PCR NEGATIVE NEGATIVE Final    Comment: (NOTE) SARS-CoV-2 target nucleic acids are NOT DETECTED.  The SARS-CoV-2 RNA is generally detectable in upper respiratory specimens during the acute phase of infection. The lowest concentration of SARS-CoV-2 viral copies this assay can detect is 138 copies/mL. A negative result does not preclude SARS-Cov-2 infection and should not be used as the sole basis for treatment or other patient management decisions. A negative result may occur with  improper specimen collection/handling, submission of specimen other than nasopharyngeal swab, presence of viral mutation(s) within the areas targeted by this assay, and inadequate number of viral copies(<138 copies/mL). A negative result must be combined with clinical observations, patient history, and epidemiological information. The expected result is Negative.  Fact Sheet for Patients:  BloggerCourse.com  Fact Sheet for Healthcare Providers:  SeriousBroker.it  This test is no t yet approved or cleared by  the [place] FDA and  has been authorized for detection and/or diagnosis of SARS-CoV-2 by FDA under an Emergency Use Authorization (EUA). This EUA will remain  in effect (meaning this test can be used) for the duration of the COVID-19 declaration under Section 564(b)(1) of the Act, 21 U.S.C.section 360bbb-3(b)(1), unless the authorization is terminated  or revoked sooner.       Influenza A by PCR NEGATIVE NEGATIVE Final   Influenza B by PCR NEGATIVE NEGATIVE Final    Comment: (NOTE) The Xpert Xpress  SARS-CoV-2/FLU/RSV plus assay is intended as an aid in the diagnosis of influenza from Nasopharyngeal swab specimens and should not be used as a sole basis for treatment. Nasal washings and aspirates are unacceptable for Xpert Xpress SARS-CoV-2/FLU/RSV testing.  Fact Sheet for Patients: BloggerCourse.com  Fact Sheet for Healthcare Providers: SeriousBroker.it  This test is not yet approved or cleared by the [place] FDA and has been authorized for detection and/or diagnosis of SARS-CoV-2 by FDA under an Emergency Use Authorization (EUA). This EUA will remain in effect (meaning this test can be used) for the duration of the COVID-19 declaration under Section 564(b)(1) of the Act, 21 U.S.C. section 360bbb-3(b)(1), unless the authorization is terminated or revoked.  Performed at [facility], [address] [place], [place] [address]   Culture, blood (routine x 2)     Status: None (Preliminary result)   Collection Time: 09/19/20  9:03 PM   Specimen: BLOOD  Result Value Ref Range Status   Specimen Description BLOOD RIGHT ANTECUBITAL  Final   Special Requests   Final    BOTTLES DRAWN AEROBIC AND ANAEROBIC Blood Culture results may not be optimal due to an inadequate volume of blood received in culture bottles   Culture   Final    NO GROWTH < 12 HOURS Performed at [facility], [address], [place],  [place] [address]    Report Status PENDING  Incomplete  Culture, blood (routine x 2)     Status: None (Preliminary result)   Collection Time: 09/19/20  9:09 PM   Specimen: BLOOD RIGHT HAND  Result Value Ref Range Status   Specimen Description BLOOD RIGHT HAND  Final   Special Requests   Final    BOTTLES DRAWN AEROBIC AND ANAEROBIC Blood Culture results may not be optimal due to an inadequate volume of blood received in culture bottles   Culture   Final    NO GROWTH < 12 HOURS Performed at [facility], [address], [place], [place] [address]    Report Status PENDING  Incomplete      Radiology Studies: DG Chest Portable 1 View  Result Date: 09/19/2020 CLINICAL DATA:  Shortness of breath, respiratory distress, pain conned frothy sputum, tachycardia, tachypnea EXAM: PORTABLE CHEST 1 VIEW COMPARISON:  Portable exam 1737 hours without priors for comparison FINDINGS: Enlargement of cardiac silhouette with pulmonary vascular congestion. Interstitial infiltrates bilaterally slightly greater on RIGHT which could represent asymmetric pulmonary edema or multifocal infection. No pleural effusion or pneumothorax. Osseous structures unremarkable. IMPRESSION: Enlargement of cardiac silhouette with pulmonary vascular congestion. Asymmetric interstitial infiltrates greater on RIGHT, question asymmetric pulmonary edema versus infection. Electronically Signed   By: Ulyses Southward M.D.   On: 09/19/2020 17:50      Scheduled Meds: . aspirin  325 mg Oral Daily  . carvedilol  3.125 mg Oral BID WC  . furosemide  40 mg Intravenous BID  . lisinopril  20 mg Oral Daily  . mouth rinse  15 mL Mouth Rinse BID  . potassium chloride  40 mEq Oral Once  . sodium chloride flush  3 mL Intravenous Q12H   Continuous Infusions: . sodium chloride    . azithromycin    . cefTRIAXone (ROCEPHIN)  IV 1 g (09/20/20 0844)  . heparin 1,250 Units/hr (09/19/20 2236)     LOS: 0 days      Time spent: 35 minutes   Noralee Stain, DO [facility] 09/20/2020, 9:58 AM   Available via Epic  secure chat 7am-7pm After these hours, please refer to coverage provider listed on [email or web address]

## 2020-09-20 NOTE — Progress Notes (Signed)
ANTICOAGULATION CONSULT NOTE - Follow Up Consult  Pharmacy Consult for IV Heparin Indication: chest pain/ACS  No Known Allergies  Patient Measurements: Height: 5\' 10"  (177.8 cm) Weight: (!) 161.9 kg (356 lb 14.4 oz) IBW/kg (Calculated) : 73 Heparin Dosing Weight: 112.8 kg  Vital Signs: Temp: 98.8 F (37.1 C) (03/28 2021) Temp Source: Oral (03/28 2021) BP: 118/83 (03/28 2021) Pulse Rate: 95 (03/28 2021)  Labs: Recent Labs    09/19/20 1723 09/19/20 1737 09/19/20 1924 09/20/20 0001 09/20/20 0701 09/20/20 1825  HGB 17.0 18.0*  --   --  15.4  --   HCT 50.9 53.0*  --   --  45.5  --   PLT 271  --   --   --  223  --   HEPARINUNFRC  --   --   --   --  0.11* <0.10*  CREATININE 1.28*  --   --   --  0.96  --   TROPONINIHS 56*  --  309* 989*  --   --     Estimated Creatinine Clearance: 155.5 mL/min (by C-G formula based on SCr of 0.96 mg/dL).   Medical History: Past Medical History:  Diagnosis Date  . Hypertension     Medications:  Scheduled:  . aspirin  325 mg Oral Daily  . atorvastatin  20 mg Oral Daily  . carvedilol  3.125 mg Oral BID WC  . furosemide  40 mg Intravenous BID  . insulin aspart  0-5 Units Subcutaneous QHS  . insulin aspart  0-9 Units Subcutaneous TID WC  . mouth rinse  15 mL Mouth Rinse BID  . [START ON 09/21/2020] sacubitril-valsartan  1 tablet Oral BID  . sodium chloride flush  3 mL Intravenous Q12H  . sodium chloride flush  3 mL Intravenous Q12H    Assessment: 40 yr old man admitted for SOB/ACS and elevated troponins. Pharmacy was consulted to dose IV heparin for ACS.   Heparin level ~7 hrs after heparin 2000 units IV bolus X 1, followed by increasing heparin infusion to 1600 units/hr, was <0.10 units/ml, which is below the goal range for this pt. H/H 15.4/45.4, plt 223. Per RN, no issues with IV or bleeding observed.  Right and left heart cath planned for tomorrow, 09/21/20.  Goal of Therapy:  Heparin level 0.3-0.7 units/ml Monitor platelets  by anticoagulation protocol: Yes   Plan:  Heparin 2200 units IV bolus X 1 Increase heparin infusion to 1950 units/hr Check heparin level in 6 hrs Monitor daily heparin level, CBC Monitor for bleeding F/U after cath tomorrow  09/23/20, PharmD, BCPS, [facility] Clinical Pharmacist 09/20/2020 9:22 PM

## 2020-09-21 ENCOUNTER — Encounter (HOSPITAL_COMMUNITY): Payer: Self-pay | Admitting: Internal Medicine

## 2020-09-21 ENCOUNTER — Encounter (HOSPITAL_COMMUNITY)
Admission: EM | Disposition: A | Discharge status: Home / Self Care | Payer: Self-pay | Source: Home / Self Care | Attending: Internal Medicine

## 2020-09-21 DIAGNOSIS — I5041 Acute combined systolic (congestive) and diastolic (congestive) heart failure: Secondary | ICD-10-CM | POA: Clinically undetermined

## 2020-09-21 DIAGNOSIS — I248 Other forms of acute ischemic heart disease: Secondary | ICD-10-CM | POA: Diagnosis present

## 2020-09-21 DIAGNOSIS — J810 Acute pulmonary edema: Secondary | ICD-10-CM

## 2020-09-21 DIAGNOSIS — R739 Hyperglycemia, unspecified: Secondary | ICD-10-CM

## 2020-09-21 DIAGNOSIS — I428 Other cardiomyopathies: Secondary | ICD-10-CM

## 2020-09-21 HISTORY — PX: RIGHT/LEFT HEART CATH AND CORONARY ANGIOGRAPHY: CATH118266

## 2020-09-21 LAB — GLUCOSE, CAPILLARY
Glucose-Capillary: 111 mg/dL — ABNORMAL HIGH (ref 70–99)
Glucose-Capillary: 122 mg/dL — ABNORMAL HIGH (ref 70–99)
Glucose-Capillary: 105 mg/dL — ABNORMAL HIGH (ref 70–99)
Glucose-Capillary: 110 mg/dL — ABNORMAL HIGH (ref 70–99)

## 2020-09-21 LAB — BASIC METABOLIC PANEL
Anion gap: 6 (ref 5–15)
BUN: 13 mg/dL (ref 6–20)
CO2: 29 mmol/L (ref 22–32)
Calcium: 8.8 mg/dL — ABNORMAL LOW (ref 8.9–10.3)
Chloride: 103 mmol/L (ref 98–111)
Creatinine, Ser: 1.16 mg/dL (ref 0.61–1.24)
GFR, Estimated: >60 mL/min (ref >60)
Glucose, Bld: 119 mg/dL — ABNORMAL HIGH (ref 70–99)
Potassium: 3.6 mmol/L (ref 3.5–5.1)
Sodium: 138 mmol/L (ref 135–145)

## 2020-09-21 LAB — CBC
HCT: 44.9 % (ref 39.0–52.0)
Hemoglobin: 14.8 g/dL (ref 13.0–17.0)
MCH: 28.7 pg (ref 26.0–34.0)
MCHC: 33 g/dL (ref 30.0–36.0)
MCV: 87 fL (ref 80.0–100.0)
Platelets: 203 10*3/uL (ref 150–400)
RBC: 5.16 MIL/uL (ref 4.22–5.81)
RDW: 14 % (ref 11.5–15.5)
WBC: 10.2 10*3/uL (ref 4.0–10.5)
nRBC: 0 % (ref 0.0–0.2)

## 2020-09-21 LAB — POCT I-STAT EG7
Acid-Base Excess: 2 mmol/L (ref 0.0–2.0)
Acid-Base Excess: 3 mmol/L — ABNORMAL HIGH (ref 0.0–2.0)
Bicarbonate: 29.2 mmol/L — ABNORMAL HIGH (ref 20.0–28.0)
Bicarbonate: 29.8 mmol/L — ABNORMAL HIGH (ref 20.0–28.0)
Calcium, Ion: 1.2 mmol/L (ref 1.15–1.40)
Calcium, Ion: 1.24 mmol/L (ref 1.15–1.40)
HCT: 46 % (ref 39.0–52.0)
HCT: 47 % (ref 39.0–52.0)
Hemoglobin: 15.6 g/dL (ref 13.0–17.0)
Hemoglobin: 16 g/dL (ref 13.0–17.0)
O2 Saturation: 73 %
O2 Saturation: 75 %
Potassium: 3.6 mmol/L (ref 3.5–5.1)
Potassium: 3.7 mmol/L (ref 3.5–5.1)
Sodium: 141 mmol/L (ref 135–145)
Sodium: 142 mmol/L (ref 135–145)
TCO2: 31 mmol/L (ref 22–32)
TCO2: 31 mmol/L (ref 22–32)
pCO2, Ven: 52.3 mmHg (ref 44.0–60.0)
pCO2, Ven: 52.3 mmHg (ref 44.0–60.0)
pH, Ven: 7.355 (ref 7.250–7.430)
pH, Ven: 7.364 (ref 7.250–7.430)
pO2, Ven: 41 mmHg (ref 32.0–45.0)
pO2, Ven: 42 mmHg (ref 32.0–45.0)

## 2020-09-21 LAB — POCT I-STAT 7, (LYTES, BLD GAS, ICA,H+H)
Acid-Base Excess: 3 mmol/L — ABNORMAL HIGH (ref 0.0–2.0)
Bicarbonate: 29.4 mmol/L — ABNORMAL HIGH (ref 20.0–28.0)
Calcium, Ion: 1.23 mmol/L (ref 1.15–1.40)
HCT: 46 % (ref 39.0–52.0)
Hemoglobin: 15.6 g/dL (ref 13.0–17.0)
O2 Saturation: 99 %
Potassium: 3.6 mmol/L (ref 3.5–5.1)
Sodium: 141 mmol/L (ref 135–145)
TCO2: 31 mmol/L (ref 22–32)
pCO2 arterial: 52.3 mmHg — ABNORMAL HIGH (ref 32.0–48.0)
pH, Arterial: 7.358 (ref 7.350–7.450)
pO2, Arterial: 123 mmHg — ABNORMAL HIGH (ref 83.0–108.0)

## 2020-09-21 LAB — HEPARIN LEVEL (UNFRACTIONATED): Heparin Unfractionated: 0.32 IU/mL (ref 0.30–0.70)

## 2020-09-21 LAB — MAGNESIUM: Magnesium: 2 mg/dL (ref 1.7–2.4)

## 2020-09-21 SURGERY — RIGHT/LEFT HEART CATH AND CORONARY ANGIOGRAPHY
Anesthesia: LOCAL

## 2020-09-21 MED ORDER — ACETAMINOPHEN 325 MG PO TABS: 650.0000 mg | ORAL_TABLET | ORAL | Status: DC | PRN

## 2020-09-21 MED ORDER — HEPARIN (PORCINE) IN NACL 1000-0.9 UT/500ML-% IV SOLN
INTRAVENOUS | Status: AC
  Filled 2020-09-21: qty 1000

## 2020-09-21 MED ORDER — SPIRONOLACTONE 12.5 MG HALF TABLET
12.5000 mg | ORAL_TABLET | Freq: Every day | ORAL | Status: DC
  Administered 2020-09-21: 12.5 mg via ORAL
  Filled 2020-09-21 (×2): qty 1

## 2020-09-21 MED ORDER — VERAPAMIL HCL 2.5 MG/ML IV SOLN
INTRAVENOUS | Status: DC | PRN
  Administered 2020-09-21: 10 mL via INTRA_ARTERIAL

## 2020-09-21 MED ORDER — SODIUM CHLORIDE 0.9 % IV SOLN: 250.0000 mL | INTRAVENOUS | Status: DC | PRN

## 2020-09-21 MED ORDER — FUROSEMIDE 40 MG PO TABS
40.0000 mg | ORAL_TABLET | Freq: Every day | ORAL | Status: DC
  Filled 2020-09-21: qty 1

## 2020-09-21 MED ORDER — HYDRALAZINE HCL 20 MG/ML IJ SOLN
10.0000 mg | INTRAMUSCULAR | Status: AC | PRN
Start: 2020-09-21 — End: 2020-09-21

## 2020-09-21 MED ORDER — MIDAZOLAM HCL 2 MG/2ML IJ SOLN
INTRAMUSCULAR | Status: AC
  Filled 2020-09-21: qty 2

## 2020-09-21 MED ORDER — HEPARIN SODIUM (PORCINE) 1000 UNIT/ML IJ SOLN
INTRAMUSCULAR | Status: DC | PRN
  Administered 2020-09-21: 8000 [IU] via INTRAVENOUS

## 2020-09-21 MED ORDER — HEPARIN (PORCINE) IN NACL 1000-0.9 UT/500ML-% IV SOLN
INTRAVENOUS | Status: DC | PRN
  Administered 2020-09-21 (×2): 500 mL

## 2020-09-21 MED ORDER — ASPIRIN EC 81 MG PO TBEC
81.0000 mg | DELAYED_RELEASE_TABLET | Freq: Every day | ORAL | Status: DC
  Administered 2020-09-22: 81 mg via ORAL
  Filled 2020-09-21: qty 1

## 2020-09-21 MED ORDER — FUROSEMIDE 10 MG/ML IJ SOLN
40.0000 mg | Freq: Once | INTRAMUSCULAR | Status: AC
  Administered 2020-09-21: 40 mg via INTRAVENOUS
  Filled 2020-09-21: qty 4

## 2020-09-21 MED ORDER — IOHEXOL 350 MG/ML SOLN
INTRAVENOUS | Status: DC | PRN
  Administered 2020-09-21: 55 mL

## 2020-09-21 MED ORDER — ONDANSETRON HCL 4 MG/2ML IJ SOLN
4.0000 mg | Freq: Four times a day (QID) | INTRAMUSCULAR | Status: DC | PRN
Start: 2020-09-21 — End: 2020-09-21

## 2020-09-21 MED ORDER — LABETALOL HCL 5 MG/ML IV SOLN: 10.0000 mg | INTRAVENOUS | Status: AC | PRN

## 2020-09-21 MED ORDER — SODIUM CHLORIDE 0.9% FLUSH: 3.0000 mL | Freq: Two times a day (BID) | INTRAVENOUS | Status: DC

## 2020-09-21 MED ORDER — ASPIRIN 81 MG PO CHEW
81.0000 mg | CHEWABLE_TABLET | ORAL | Status: AC
Start: 2020-09-21 — End: 2020-09-21
  Administered 2020-09-21: 81 mg via ORAL
  Filled 2020-09-21: qty 1

## 2020-09-21 MED ORDER — DIAZEPAM 5 MG PO TABS: 5.0000 mg | ORAL_TABLET | Freq: Four times a day (QID) | ORAL | Status: DC | PRN

## 2020-09-21 MED ORDER — DAPAGLIFLOZIN PROPANEDIOL 10 MG PO TABS
10.0000 mg | ORAL_TABLET | Freq: Every day | ORAL | Status: DC
  Administered 2020-09-21 – 2020-09-22 (×2): 10 mg via ORAL
  Filled 2020-09-21 (×2): qty 1

## 2020-09-21 MED ORDER — SODIUM CHLORIDE 0.9 % IV SOLN: INTRAVENOUS | Status: AC

## 2020-09-21 MED ORDER — SODIUM CHLORIDE 0.9% FLUSH: 3.0000 mL | INTRAVENOUS | Status: DC | PRN

## 2020-09-21 MED ORDER — ATORVASTATIN CALCIUM 40 MG PO TABS
40.0000 mg | ORAL_TABLET | Freq: Every day | ORAL | Status: DC
  Administered 2020-09-22: 40 mg via ORAL
  Filled 2020-09-21: qty 1

## 2020-09-21 MED ORDER — LIDOCAINE HCL (PF) 1 % IJ SOLN
INTRAMUSCULAR | Status: AC
  Filled 2020-09-21: qty 30

## 2020-09-21 MED ORDER — HYDRALAZINE HCL 20 MG/ML IJ SOLN: 10.0000 mg | Freq: Four times a day (QID) | INTRAMUSCULAR | Status: DC | PRN

## 2020-09-21 MED ORDER — MIDAZOLAM HCL 2 MG/2ML IJ SOLN
INTRAMUSCULAR | Status: DC | PRN
  Administered 2020-09-21: 2 mg via INTRAVENOUS
  Administered 2020-09-21: 1 mg via INTRAVENOUS

## 2020-09-21 MED ORDER — ENOXAPARIN SODIUM 40 MG/0.4ML ~~LOC~~ SOLN
40.0000 mg | SUBCUTANEOUS | Status: DC
  Administered 2020-09-22: 40 mg via SUBCUTANEOUS
  Filled 2020-09-21: qty 0.4

## 2020-09-21 MED ORDER — LIDOCAINE HCL (PF) 1 % IJ SOLN
INTRAMUSCULAR | Status: DC | PRN
  Administered 2020-09-21 (×2): 2 mL

## 2020-09-21 MED ORDER — FENTANYL CITRATE (PF) 100 MCG/2ML IJ SOLN
INTRAMUSCULAR | Status: DC | PRN
  Administered 2020-09-21: 25 ug via INTRAVENOUS
  Administered 2020-09-21: 50 ug via INTRAVENOUS

## 2020-09-21 MED ORDER — SODIUM CHLORIDE 0.9 % IV SOLN: INTRAVENOUS | Status: DC

## 2020-09-21 MED ORDER — GLUCERNA SHAKE PO LIQD: 237.0000 mL | Freq: Two times a day (BID) | ORAL | Status: DC

## 2020-09-21 MED ORDER — HEPARIN SODIUM (PORCINE) 1000 UNIT/ML IJ SOLN
INTRAMUSCULAR | Status: AC
  Filled 2020-09-21: qty 1

## 2020-09-21 MED ORDER — POTASSIUM CHLORIDE CRYS ER 20 MEQ PO TBCR
40.0000 meq | EXTENDED_RELEASE_TABLET | Freq: Once | ORAL | Status: AC
  Administered 2020-09-21: 40 meq via ORAL
  Filled 2020-09-21: qty 2

## 2020-09-21 MED ORDER — VERAPAMIL HCL 2.5 MG/ML IV SOLN
INTRAVENOUS | Status: AC
  Filled 2020-09-21: qty 2

## 2020-09-21 MED ORDER — ASPIRIN 81 MG PO CHEW: 81.0000 mg | CHEWABLE_TABLET | Freq: Every day | ORAL | Status: DC

## 2020-09-21 MED ORDER — FENTANYL CITRATE (PF) 100 MCG/2ML IJ SOLN
INTRAMUSCULAR | Status: AC
  Filled 2020-09-21: qty 2

## 2020-09-21 SURGICAL SUPPLY — 15 items
CATH INFINITI 5 FR JL3.5 (CATHETERS) ×1 IMPLANT
CATH INFINITI JR4 5F (CATHETERS) ×1 IMPLANT
CATH OPTITORQUE TIG 4.0 5F (CATHETERS) ×1 IMPLANT
CATH SWAN GANZ 7F STRAIGHT (CATHETERS) ×1 IMPLANT
DEVICE RAD COMP TR BAND LRG (VASCULAR PRODUCTS) ×1 IMPLANT
GLIDESHEATH SLEND SS 6F .021 (SHEATH) ×1 IMPLANT
GLIDESHEATH SLENDER 7FR .021G (SHEATH) ×1 IMPLANT
GUIDEWIRE .025 260CM (WIRE) ×1 IMPLANT
GUIDEWIRE INQWIRE 1.5J.035X260 (WIRE) IMPLANT
INQWIRE 1.5J .035X260CM (WIRE) ×2
KIT HEART LEFT (KITS) ×2 IMPLANT
PACK CARDIAC CATHETERIZATION (CUSTOM PROCEDURE TRAY) ×2 IMPLANT
SHEATH PROBE COVER 6X72 (BAG) ×1 IMPLANT
TRANSDUCER W/STOPCOCK (MISCELLANEOUS) ×2 IMPLANT
TUBING CIL FLEX 10 FLL-RA (TUBING) ×2 IMPLANT

## 2020-09-21 NOTE — Interval H&P Note (Signed)
Cath Lab Visit (complete for each Cath Lab visit)  Clinical Evaluation Leading to the Procedure:   ACS: No.  Non-ACS:    Anginal Classification: No Symptoms  Anti-ischemic medical therapy: Minimal Therapy (1 class of medications)  Non-Invasive Test Results: No non-invasive testing performed  Prior CABG: No previous CABG      History and Physical Interval Note:  09/21/2020 10:52 AM  Alex Allen  has presented today for surgery, with the diagnosis of heart failure.  The various methods of treatment have been discussed with the patient and family. After consideration of risks, benefits and other options for treatment, the patient has consented to  Procedure(s): RIGHT/LEFT HEART CATH AND CORONARY ANGIOGRAPHY (N/A) as a surgical intervention.  The patient's history has been reviewed, patient examined, no change in status, stable for surgery.  I have reviewed the patient's chart and labs.  Questions were answered to the patient's satisfaction.     Nicki Guadalajara

## 2020-09-21 NOTE — Care Management (Signed)
09-21-20 1417 Case Manager received a consult for PCP needs. Case Manager reached out to the patient and he has Editor, commissioning; however, he doses not have the card on him. The patient will have to call the 1-800 number for [employer] and patient will have to make his PCP appointment. Unable to do a benefits check for Marcelline Deist and Sherryll Burger without the insurance card being on file. Case Manager asked to call his pharmacy [facility] and the new card is not on file at this location. Graves-Bigel[place], Lamar Laundry, RN, BSN Case Manager

## 2020-09-21 NOTE — Progress Notes (Signed)
Patient resting comfortably on room air. No respiratory distress noted. BIPAP not needed at this time. RT will monitor as needed. ?

## 2020-09-21 NOTE — Progress Notes (Signed)
Initial Nutrition Assessment  DOCUMENTATION CODES:   Morbid obesity  INTERVENTION:  Provide Glucerna Shake po BID, each supplement provides 220 kcal and 10 grams of protein  Diet handout regarding diabetes given.   NUTRITION DIAGNOSIS:   Increased nutrient needs related to chronic illness (CHF) as evidenced by estimated needs.  GOAL:   Patient will meet greater than or equal to 90% of their needs  MONITOR:   PO intake,Supplement acceptance,Skin,Weight trends,Labs,I & O's  REASON FOR ASSESSMENT:   Consult Diet education  ASSESSMENT:   40 y.o. male with a hx of  hypertension presents with shortness of breath, hypertensive emergency, acute CHF and elevated troponin   Pt underwent right/left heart cath and coronary angiography today. Pt unavailable during time of contact. RD unable to obtain pt nutrition history. RD to order nutritional supplements to aid in caloric and protein needs as well as in post op healing. RD additionally consulted for diet education regarding new onset diabetes. Handout "[place] Carbohydrates for People with Diabetes" from the Academy of Nutrition and Dietetics Manual placed in pt discharge instructions. Unable to complete Nutrition-Focused physical exam at this time.   Labs and medications reviewed .  Diet Order:   Diet Order            Diet heart healthy/carb modified Room service appropriate? Yes; Fluid consistency: Thin  Diet effective now                 EDUCATION NEEDS:   Not appropriate for education at this time  Skin:  Skin Assessment: Reviewed RN Assessment  Last BM:  3/27  Height:   Ht Readings from Last 1 Encounters:  09/19/20 5\' 10"  (1.778 m)    Weight:   Wt Readings from Last 1 Encounters:  09/21/20 (!) 161.8 kg   BMI:  Body mass index is 51.2 kg/m.  Estimated Nutritional Needs:   Kcal:  2200-2400  Protein:  115-130 grams  Fluid:  2 L/day  09/23/20, MS, RD, LDN RD pager number/after hours weekend  pager number on Amion.

## 2020-09-21 NOTE — Progress Notes (Signed)
Progress Note  Patient Name: Alex Allen Date of Encounter: 09/21/2020  [facility] Cardiologist: No primary care provider on file. Bryan Lemma, MD   Subjective   Feels pretty good today.  Breathing much better.  No chest tightness or pain. (Seen just after heart catheterization.  Happy to hear results)  Inpatient Medications    Scheduled Meds: . [START ON 09/22/2020] aspirin EC  81 mg Oral Daily  . [START ON 09/22/2020] atorvastatin  40 mg Oral Daily  . carvedilol  3.125 mg Oral BID WC  . dapagliflozin propanediol  10 mg Oral Daily  . [START ON 09/22/2020] enoxaparin (LOVENOX) injection  40 mg Subcutaneous Q24H  . feeding supplement (GLUCERNA SHAKE)  237 mL Oral BID BM  . furosemide  40 mg Intravenous Once  . [START ON 09/22/2020] furosemide  40 mg Oral Daily  . insulin aspart  0-5 Units Subcutaneous QHS  . insulin aspart  0-9 Units Subcutaneous TID WC  . mouth rinse  15 mL Mouth Rinse BID  . sacubitril-valsartan  1 tablet Oral BID  . sodium chloride flush  3 mL Intravenous Q12H  . sodium chloride flush  3 mL Intravenous Q12H  . sodium chloride flush  3 mL Intravenous Q12H  . spironolactone  12.5 mg Oral Daily   Continuous Infusions: . sodium chloride    . sodium chloride 100 mL/hr at 09/21/20 1525  . sodium chloride    . azithromycin 250 mg (09/21/20 1023)  . cefTRIAXone (ROCEPHIN)  IV 1 g (09/21/20 0900)   PRN Meds: sodium chloride, sodium chloride, acetaminophen, albuterol, diazepam, hydrALAZINE, hydrALAZINE, labetalol, ondansetron (ZOFRAN) IV, sodium chloride flush, sodium chloride flush   Vital Signs    Vitals:   09/21/20 1140 09/21/20 1206 09/21/20 1208 09/21/20 1220  BP: (!) 133/92 (!) 122/92  (!) 130/95  Pulse: 97 94  91  Resp: (!) 23 18    Temp:   97.9 F (36.6 C)   TempSrc:   Oral   SpO2: 99% 92%  93%  Weight:      Height:        Intake/Output Summary (Last 24 hours) at 09/21/2020 1624 Last data filed at 09/21/2020 1500 Gross per 24 hour   Intake 840 ml  Output 1700 ml  Net -860 ml   Last 3 Weights 09/21/2020 09/20/2020 09/19/2020  Weight (lbs) 356 lb 12.8 oz 356 lb 14.4 oz 359 lb 9.6 oz  Weight (kg) 161.843 kg 161.889 kg 163.113 kg      Telemetry    Sinus rhythm with left bundle branch block.- Personally Reviewed  ECG    No new tracing- Personally Reviewed  Physical Exam   GEN: No acute distress.  Morbid obese gentleman being resting in bed. Neck:  Unable to assess JVD, but seems to be down. Cardiac:  Borderline rate, otherwise distant heart sounds.  RRR, normal S1-S2.  Cannot exclude S4 gallop, otherwise no murmurs or rubs. Respiratory: Clear to auscultation bilaterally.  Nonlabored, good air movement. GI: Soft, nontender, non-distended ; obese MS: No edema; No deformity. Neuro:  Nonfocal  Psych: Normal affect   Labs    High Sensitivity Troponin:   Recent Labs  Lab 09/19/20 1723 09/19/20 1924 09/20/20 0001  TROPONINIHS 56* 309* 989*      Chemistry Recent Labs  Lab 09/19/20 1723 09/19/20 1737 09/20/20 0701 09/21/20 0307  NA 136 139 138 138  K 3.0* 3.1* 3.6 3.6  CL 99  --  103 103  CO2 27  --  29 29  GLUCOSE 205*  --  130* 119*  BUN 9  --  9 13  CREATININE 1.28*  --  0.96 1.16  CALCIUM 8.6*  --  8.9 8.8*  PROT  --   --  6.8  --   ALBUMIN  --   --  3.2*  --   AST  --   --  40  --   ALT  --   --  31  --   ALKPHOS  --   --  74  --   BILITOT  --   --  1.0  --   GFRNONAA >60  --  >60 >60  ANIONGAP 10  --  6 6     Hematology Recent Labs  Lab 09/19/20 1723 09/19/20 1737 09/20/20 0701 09/21/20 0307  WBC 16.5*  --  11.3* 10.2  RBC 5.93*  --  5.37 5.16  HGB 17.0 18.0* 15.4 14.8  HCT 50.9 53.0* 45.5 44.9  MCV 85.8  --  84.7 87.0  MCH 28.7  --  28.7 28.7  MCHC 33.4  --  33.8 33.0  RDW 13.6  --  13.8 14.0  PLT 271  --  223 203    BNP Recent Labs  Lab 09/19/20 1723  BNP 181.5*     DDimer  Recent Labs  Lab 09/19/20 2338  DDIMER 2.81*     Radiology    CARDIAC  CATHETERIZATION  Result Date: 09/21/2020  1st Diag lesion is 50% stenosed.  Mid LAD lesion is 20% stenosed.  RPDA lesion is 30% stenosed.  Mid RCA lesion is 20% stenosed.  Prox Cx lesion is 30% stenosed.  Prox LAD lesion is 10% stenosed.  Mild nonobstructive CAD in very large caliber coronary arteries with 50% diffuse narrowing in the first diagonal vessel, mild luminal irregularity of the LAD with 20% narrowing after the diagonal takeoff; mild 5% narrowing in the proximal circumflex and large dominant RCA with regularity and 30% focal mid PDA stenosis on an angled mid PDA segment. Minimally elevated right heart pressures. Fick cardiac output 7.1 L/min with cardiac index 2.6 L/min/m. RECOMMENDATION: Medical therapy for CAD.  Guideline directed therapy for HFrEF with ultimate transition from ARB to Entresto, carvedilol, spironolactone, and possible SGLT2 inhibition.  Will initiate lipid-lowering therapy with target LDL less than 70.  Optimal blood pressure control with target blood pressure less than 130/80 and ideal blood pressure less than 120/80.  Weight loss is essential in this patient weighing approximately 360 pounds.    DG Chest Portable 1 View  Result Date: 09/19/2020 CLINICAL DATA:  Shortness of breath, respiratory distress, pain conned frothy sputum, tachycardia, tachypnea EXAM: PORTABLE CHEST 1 VIEW COMPARISON:  Portable exam 1737 hours without priors for comparison FINDINGS: Enlargement of cardiac silhouette with pulmonary vascular congestion. Interstitial infiltrates bilaterally slightly greater on RIGHT which could represent asymmetric pulmonary edema or multifocal infection. No pleural effusion or pneumothorax. Osseous structures unremarkable. IMPRESSION: Enlargement of cardiac silhouette with pulmonary vascular congestion. Asymmetric interstitial infiltrates greater on RIGHT, question asymmetric pulmonary edema versus infection. Electronically Signed   By: Ulyses Southward M.D.   On:  09/19/2020 17:50   ECHOCARDIOGRAM COMPLETE  Result Date: 09/20/2020    ECHOCARDIOGRAM REPORT   Patient Name:   Alex Allen Date of Exam: 09/20/2020 Medical Rec #:  [id number]       Height:       70.0 in Accession #:    [id number]      Weight:  356.9 lb Date of Birth:  Mar 30, 1981        BSA:          2.670 m Patient Age:    40 years        BP:           125/95 mmHg Patient Gender: M               HR:           93 bpm. Exam Location:  Inpatient Procedure: 2D Echo, Cardiac Doppler, Color Doppler and Intracardiac            Opacification Agent Indications:    CHF-Acute Systolic  History:        Patient has no prior history of Echocardiogram examinations.                 Signs/Symptoms:Shortness of Breath; Risk Factors:Hypertension.  Sonographer:    Ross Ludwig RDCS (AE) Referring Phys: [id number] Deno Lunger [facility]  Sonographer Comments: Patient is morbidly obese. IMPRESSIONS  1. Left ventricular ejection fraction, by estimation, is 30%. The left ventricle has moderate to severely decreased function. The left ventricle demonstrates regional wall motion abnormalities with basal to mid inferior and inferoseptal akinesis, anteroseptal severe hypokinesis. The left ventricular internal cavity size was mildly dilated. There is moderate left ventricular hypertrophy. Left ventricular diastolic parameters are indeterminate.  2. Right ventricular systolic function is normal. The right ventricular size is normal. Tricuspid regurgitation signal is inadequate for assessing PA pressure.  3. Left atrial size was mildly dilated.  4. The mitral valve is normal in structure. No evidence of mitral valve regurgitation. No evidence of mitral stenosis.  5. The aortic valve is tricuspid. Aortic valve regurgitation is not visualized. No aortic stenosis is present.  6. The inferior vena cava is dilated in size with <50% respiratory variability, suggesting right atrial pressure of 15 mmHg. FINDINGS  Left Ventricle: Left ventricular ejection  fraction, by estimation, is 30%. The left ventricle has moderate to severely decreased function. The left ventricle demonstrates regional wall motion abnormalities. Definity contrast agent was given IV to delineate the left ventricular endocardial borders. The left ventricular internal cavity size was mildly dilated. There is moderate left ventricular hypertrophy. Left ventricular diastolic parameters are indeterminate. Right Ventricle: The right ventricular size is normal. No increase in right ventricular wall thickness. Right ventricular systolic function is normal. Tricuspid regurgitation signal is inadequate for assessing PA pressure. Left Atrium: Left atrial size was mildly dilated. Right Atrium: Right atrial size was normal in size. Pericardium: There is no evidence of pericardial effusion. Mitral Valve: The mitral valve is normal in structure. There is mild calcification of the mitral valve leaflet(s). Mild mitral annular calcification. No evidence of mitral valve regurgitation. No evidence of mitral valve stenosis. Tricuspid Valve: The tricuspid valve is normal in structure. Tricuspid valve regurgitation is not demonstrated. Aortic Valve: The aortic valve is tricuspid. Aortic valve regurgitation is not visualized. No aortic stenosis is present. Aortic valve mean gradient measures 3.0 mmHg. Aortic valve peak gradient measures 5.3 mmHg. Aortic valve area, by VTI measures 3.58 cm. Pulmonic Valve: The pulmonic valve was normal in structure. Pulmonic valve regurgitation is not visualized. Aorta: The aortic root is normal in size and structure. Venous: The inferior vena cava is dilated in size with less than 50% respiratory variability, suggesting right atrial pressure of 15 mmHg. IAS/Shunts: No atrial level shunt detected by color flow Doppler.  LEFT VENTRICLE PLAX 2D LVIDd:  6.00 cm LVIDs:         5.30 cm LV PW:         1.40 cm LV IVS:        1.60 cm LVOT diam:     2.50 cm LV SV:         67 LV SV Index:    25 LVOT Area:     4.91 cm  LV Volumes (MOD) LV vol d, MOD A2C: 175.0 ml LV vol d, MOD A4C: 194.0 ml LV vol s, MOD A2C: 103.0 ml LV vol s, MOD A4C: 140.0 ml LV SV MOD A2C:     72.0 ml LV SV MOD A4C:     194.0 ml LV SV MOD BP:      62.8 ml RIGHT VENTRICLE            IVC RV Basal diam:  3.50 cm    IVC diam: 2.30 cm RV S prime:     9.57 cm/s TAPSE (M-mode): 1.1 cm LEFT ATRIUM             Index       RIGHT ATRIUM           Index LA diam:        3.50 cm 1.31 cm/m  RA Area:     16.80 cm LA Vol (A2C):   78.0 ml 29.22 ml/m RA Volume:   48.20 ml  18.05 ml/m LA Vol (A4C):   86.9 ml 32.55 ml/m LA Biplane Vol: 86.3 ml 32.32 ml/m  AORTIC VALVE AV Area (Vmax):    3.77 cm AV Area (Vmean):   3.87 cm AV Area (VTI):     3.58 cm AV Vmax:           115.00 cm/s AV Vmean:          81.500 cm/s AV VTI:            0.188 m AV Peak Grad:      5.3 mmHg AV Mean Grad:      3.0 mmHg LVOT Vmax:         88.30 cm/s LVOT Vmean:        64.300 cm/s LVOT VTI:          0.137 m LVOT/AV VTI ratio: 0.73  AORTA Ao Root diam: 3.50 cm Ao Asc diam:  3.60 cm  SHUNTS Systemic VTI:  0.14 m Systemic Diam: 2.50 cm Marca Anconaalton Mclean MD Electronically signed by Marca Anconaalton Mclean MD Signature Date/Time: 09/20/2020/3:08:48 PM    Final     Cardiac Studies     TTE 09/20/2020: EF~30%.  Moderate to severely reduced function.  Regional wall motion abnormality: Basal to mid inferior and inferoseptal AK.  Anteroseptal severe HK.  Mild dilation.  Moderate LVH.  Unable to assess diastolic parameters.  Also unable to assess PA pressures, but RV systolic function appears normal.  LA is mildly dilated.  No significant valvular lesions.  Severely dilated IVC suggestive of elevated RAP/CVP 15 mmHg.   Right Left Heart Cath 09/21/2020:   Mild nonobstructive CAD with large caliber vessels.  Proximal LAD 10%, mid LAD 20% & D1 50%.   Proximal LCx 30%.  Mid RCA 20%, RPDA 30%.  Cardiac output-index 7.1 L/min - 2.6 L/min/m2; PAP-mean 31/16 mmHg - 24 mmHg.  PCWP mean 16 mmHg.  LVEDP  19 mmHg   Patient Profile:   Alex Allen is a 40 y.o. male with a hx of  hypertension and morbid obesity with BMI of  51 who is being seen in consultation for the evaluation of hypertensive emergency, acute CHF and elevated troponin at the request of Dr. Alvino Chapel.  By echocardiogram, his EF was estimated to be 25 to 30% with regional wall motion abnormality.  Admission was consistent with acute  combined systolic and diastolic heart failure.  Referred for cardiac catheterization to determine ischemic versus nonischemic.  Assessment & Plan    Principal Problem:   Acute respiratory failure with hypoxia (HCC) Active Problems:   Demand ischemia of myocardium (HCC)   Nonischemic cardiomyopathy (HCC)   Acute combined systolic and diastolic heart failure (HCC)   Hypertensive emergency   Hypokalemia   Class 3 severe obesity due to excess calories with serious comorbidity and body mass index (BMI) of 45.0 to 49.9 in adult (HCC)   Hyperglycemia   SIRS (systemic inflammatory response syndrome) (HCC)  Principal Problem:   Acute respiratory failure with hypoxia (H secondary to CC) -> Acute Combined Systolic and Diastolic Heart Failure/Nonischemic Cardiomyopathy Complicated by Hypertensive Emergency with Demand Ischemia of myocardium (HCC)  Echo EF roughly 30% with regional wall motion normalities, concerning for possible ischemia, however cardiac catheterization showed mild nonobstructive CAD and only minimally elevated  RHC pressures.  With LVEDP and wedge pressure 16-18 mmHg, will give 1 more dose of IV Lasix today and then convert to oral Lasix plus spironolactone 12.5 mg tomorrow.  We will start Entresto tonight (36-hour lisinopril hold is up as of tonight), continue carvedilol  Start on Farxiga  Mild to moderate CAD: On aspirin, loaded on statin, and on carvedilol.     Hypokalemia   Class 3 severe obesity due to excess calories with serious comorbidity and body mass index (BMI) of 45.0  to 49.9 in adult [facility])  I suspect he has OSA, will need sleep apnea study as part of his outpatient follow-up evaluation.  I have asked the nurse to have him sleep at the beginning of the night without oxygen in place as we can monitor nighttime oxygen saturations.  This could potentially help with sleep evaluation.    Hyperglycemia -> starting Farxiga    SIRS (systemic inflammatory response syndrome) (HCC)-> being covered for possible CAP   We will follow-up tomorrow.  I suspect if his blood pressures look okay, and he tolerates the Entresto overnight that he may be ready for discharge tomorrow pending renal function, electrolyte and blood pressure assessment.  He will need close follow-up with APP in order to establish and titrate medications.  He will be set up in the heart failure coordinator clinic as well for educational purposes.  Discussing scales and daily weights etc.   For questions or updates, please contact [facility] Please consult [email or web address] for contact info under        Signed, Bryan Lemma, MD  09/21/2020, 4:24 PM

## 2020-09-21 NOTE — Progress Notes (Signed)
Pt had 9 beat run vtach this am, asymptomatic. Dr. Alvino Chapel notified, new orders placed. Emelda Brothers RN

## 2020-09-21 NOTE — Discharge Instructions (Signed)
Carbohydrate Counting For People With Diabetes  Foods with carbohydrates make your blood glucose level go up. Learning how to count carbohydrates can help you control your blood glucose levels. First, identify the foods you eat that contain carbohydrates. Then, using the Foods with Carbohydrates chart, determine about how much carbohydrates are in your meals and snacks. Make sure you are eating foods with fiber, protein, and healthy fat along with your carbohydrate foods. Foods with Carbohydrates The following table shows carbohydrate foods that have about 15 grams of carbohydrate each. Using measuring cups, spoons, or a food scale when you first begin learning about carbohydrate counting can help you learn about the portion sizes you typically eat. The following foods have 15 grams carbohydrate each:  Grains 1 slice bread (1 ounce)  1 small tortilla (6-inch size)   large bagel (1 ounce)  1/3 cup pasta or rice (cooked)   hamburger or hot dog bun ( ounce)   cup cooked cereal   to  cup ready-to-eat cereal  2 taco shells (5-inch size) Fruit 1 small fresh fruit ( to 1 cup)   medium banana  17 small grapes (3 ounces)  1 cup melon or berries   cup canned or frozen fruit  2 tablespoons dried fruit (blueberries, cherries, cranberries, raisins)   cup unsweetened fruit juice  Starchy Vegetables  cup cooked beans, peas, corn, potatoes/sweet potatoes   large baked potato (3 ounces)  1 cup acorn or butternut squash  Snack Foods 3 to 6 crackers  8 potato chips or 13 tortilla chips ( ounce to 1 ounce)  3 cups popped popcorn  Dairy 3/4 cup (6 ounces) nonfat plain yogurt, or yogurt with sugar-free sweetener  1 cup milk  1 cup plain rice, soy, coconut or flavored almond milk Sweets and Desserts  cup ice cream or frozen yogurt  1 tablespoon jam, jelly, pancake syrup, table sugar, or honey  2 tablespoons light pancake syrup  1 inch square of frosted cake or 2 inch square of unfrosted  cake  2 small cookies (2/3 ounce each) or  large cookie  Sometimes you'll have to estimate carbohydrate amounts if you don't know the exact recipe. One cup of mixed foods like soups can have 1 to 2 carbohydrate servings, while some casseroles might have 2 or more servings of carbohydrate. Foods that have less than 20 calories in each serving can be counted as "free" foods. Count 1 cup raw vegetables, or  cup cooked non-starchy vegetables as "free" foods. If you eat 3 or more servings at one meal, then count them as 1 carbohydrate serving.  Foods without Carbohydrates  Not all foods contain carbohydrates. Meat, some dairy, fats, non-starchy vegetables, and many beverages don't contain carbohydrate. So when you count carbohydrates, you can generally exclude chicken, pork, beef, fish, seafood, eggs, tofu, cheese, butter, sour cream, avocado, nuts, seeds, olives, mayonnaise, water, black coffee, unsweetened tea, and zero-calorie drinks. Vegetables with no or low carbohydrate include green beans, cauliflower, tomatoes, and onions. How much carbohydrate should I eat at each meal?  Carbohydrate counting can help you plan your meals and manage your weight. Following are some starting points for carbohydrate intake at each meal. Work with your registered dietitian nutritionist to find the best range that works for your blood glucose and weight.   To Lose Weight To Maintain Weight  Women 2 - 3 carb servings 3 - 4 carb servings  Men 3 - 4 carb servings 4 - 5 carb servings  Checking your   blood glucose after meals will help you know if you need to adjust the timing, type, or number of carbohydrate servings in your meal plan. Achieve and keep a healthy body weight by balancing your food intake and physical activity.  Tips How should I plan my meals?  Plan for half the food on your plate to include non-starchy vegetables, like salad greens, broccoli, or carrots. Try to eat 3 to 5 servings of non-starchy vegetables  every day. Have a protein food at each meal. Protein foods include chicken, fish, meat, eggs, or beans (note that beans contain carbohydrate). These two food groups (non-starchy vegetables and proteins) are low in carbohydrate. If you fill up your plate with these foods, you will eat less carbohydrate but still fill up your stomach. Try to limit your carbohydrate portion to  of the plate.  What fats are healthiest to eat?  Diabetes increases risk for heart disease. To help protect your heart, eat more healthy fats, such as olive oil, nuts, and avocado. Eat less saturated fats like butter, cream, and high-fat meats, like bacon and sausage. Avoid trans fats, which are in all foods that list "partially hydrogenated oil" as an ingredient. What should I drink?  Choose drinks that are not sweetened with sugar. The healthiest choices are water, carbonated or seltzer waters, and tea and coffee without added sugars.  Sweet drinks will make your blood glucose go up very quickly. One serving of soda or energy drink is  cup. It is best to drink these beverages only if your blood glucose is low.  Artificially sweetened, or diet drinks, typically do not increase your blood glucose if they have zero calories in them. Read labels of beverages, as some diet drinks do have carbohydrate and will raise your blood glucose. Label Reading Tips Read Nutrition Facts labels to find out how many grams of carbohydrate are in a food you want to eat. Don't forget: sometimes serving sizes on the label aren't the same as how much food you are going to eat, so you may need to calculate how much carbohydrate is in the food you are serving yourself.   Carbohydrate Counting for People with Diabetes Sample 1-Day Menu  Breakfast  cup yogurt, low fat, low sugar (1 carbohydrate serving)   cup cereal, ready-to-eat, unsweetened (1 carbohydrate serving)  1 cup strawberries (1 carbohydrate serving)   cup almonds ( carbohydrate serving)   Lunch 1, 5 ounce can chunk light tuna  2 ounces cheese, low fat cheddar  6 whole wheat crackers (1 carbohydrate serving)  1 small apple (1 carbohydrate servings)   cup carrots ( carbohydrate serving)   cup snap peas  1 cup 1% milk (1 carbohydrate serving)   Evening Meal Stir fry made with: 3 ounces chicken  1 cup brown rice (3 carbohydrate servings)   cup broccoli ( carbohydrate serving)   cup green beans   cup onions  1 tablespoon olive oil  2 tablespoons teriyaki sauce ( carbohydrate serving)  Evening Snack 1 extra small banana (1 carbohydrate serving)  1 tablespoon peanut butter   Carbohydrate Counting for People with Diabetes Vegan Sample 1-Day Menu  Breakfast 1 cup cooked oatmeal (2 carbohydrate servings)   cup blueberries (1 carbohydrate serving)  2 tablespoons flaxseeds  1 cup soymilk fortified with calcium and vitamin D  1 cup coffee  Lunch 2 slices whole wheat bread (2 carbohydrate servings)   cup baked tofu   cup lettuce  2 slices tomato  2 slices avocado     cup baby carrots ( carbohydrate serving)  1 orange (1 carbohydrate serving)  1 cup soymilk fortified with calcium and vitamin D   Evening Meal Burrito made with: 1 6-inch corn tortilla (1 carbohydrate serving)  1 cup refried vegetarian beans (2 carbohydrate servings)   cup chopped tomatoes   cup lettuce   cup salsa  1/3 cup brown rice (1 carbohydrate serving)  1 tablespoon olive oil for rice   cup zucchini   Evening Snack 6 small whole grain crackers (1 carbohydrate serving)  2 apricots ( carbohydrate serving)   cup unsalted peanuts ( carbohydrate serving)    Carbohydrate Counting for People with Diabetes Vegetarian (Lacto-Ovo) Sample 1-Day Menu  Breakfast 1 cup cooked oatmeal (2 carbohydrate servings)   cup blueberries (1 carbohydrate serving)  2 tablespoons flaxseeds  1 egg  1 cup 1% milk (1 carbohydrate serving)  1 cup coffee  Lunch 2 slices whole wheat bread (2 carbohydrate  servings)  2 ounces low-fat cheese   cup lettuce  2 slices tomato  2 slices avocado   cup baby carrots ( carbohydrate serving)  1 orange (1 carbohydrate serving)  1 cup unsweetened tea  Evening Meal Burrito made with: 1 6-inch corn tortilla (1 carbohydrate serving)   cup refried vegetarian beans (1 carbohydrate serving)   cup tomatoes   cup lettuce   cup salsa  1/3 cup brown rice (1 carbohydrate serving)  1 tablespoon olive oil for rice   cup zucchini  1 cup 1% milk (1 carbohydrate serving)  Evening Snack 6 small whole grain crackers (1 carbohydrate serving)  2 apricots ( carbohydrate serving)   cup unsalted peanuts ( carbohydrate serving)    Copyright 2020  Academy of [employer]etics. All rights reserved.  Using Nutrition Labels: Carbohydrate  Serving Size  Look at the serving size. All the information on the label is based on this portion. Servings Per Container  The number of servings contained in the package. Guidelines for Carbohydrate  Look at the total grams of carbohydrate in the serving size.  1 carbohydrate choice = 15 grams of carbohydrate. Range of Carbohydrate Grams Per Choice  Carbohydrate Grams/Choice Carbohydrate Choices  6-10   11-20 1  21-25 1  26-35 2  36-40 2  41-50 3  51-55 3  56-65 4  66-70 4  71-80 5    Copyright 2020  Academy of [employer]. All rights reserved.  Rihanna Marseille, MS, RD, LDN Clinical Dietitian Office phone [phone] 

## 2020-09-21 NOTE — Progress Notes (Signed)
Heart Failure Nurse Navigator Progress Note  PCP: Bing Neighbors, FNP PCP-Cardiologist: Ranae Palms., MD (new) Admission Diagnosis: Acute Resp Distress Admitted from: home, plans to discharge to mother's house.  PT STATES HE HAS BCBS OF [place]  Presentation:   Alex Allen presented with sudden onset of SOB. Per pt had COVID-19 06/2020, which he was able to stay home. Pt resting in bed eating lunch on room air, s/p R/L HC. New symptoms and new dx of HF noted.   ECHO/ LVEF: 30%, mod LV hypertrophy. LA mildly dilated.   Clinical Course:  Past Medical History:  Diagnosis Date  . Hypertension    Social History   Socioeconomic History  . Marital status: Single    Spouse name: Not on file  . Number of children: Not on file  . Years of education: 76  . Highest education level: Doctorate  Occupational History  . Occupation: Exceptional Photographer: [employer]  Tobacco Use  . Smoking status: Current Some Day Smoker    Years: 15.00    Types: Cigars  . Smokeless tobacco: Never Used  Vaping Use  . Vaping Use: Never used  Substance and Sexual Activity  . Alcohol use: Yes    Comment: Socially  . Drug use: No  . Sexual activity: Not Currently  Other Topics Concern  . Not on file  Social History Narrative  . Not on file   Social Determinants of Health   Financial Resource Strain: Low Risk   . Difficulty of Paying Living Expenses: Not hard at all  Food Insecurity: No Food Insecurity  . Worried About Programme researcher, broadcasting/film/video in the Last Year: Never true  . Ran Out of Food in the Last Year: Never true  Transportation Needs: No Transportation Needs  . Lack of Transportation (Medical): No  . Lack of Transportation (Non-Medical): No  Physical Activity: Inactive  . Days of Exercise per Week: 0 days  . Minutes of Exercise per Session: 0 min  Stress: Not on file  Social Connections: Not on file   High Risk Criteria for Readmission  and/or Poor Patient Outcomes:  Heart failure hospital admissions (last 6 months): 1  No Show rate: NA  Difficult social situation: no  Demonstrates medication adherence: yes  Primary Language: English  Literacy level: Able to read/write and comprehend  Education Assessment and Provision:  Detailed education and instructions provided on heart failure disease management including the following:  Signs and symptoms of Heart Failure When to call the physician Importance of daily weights Low sodium diet Fluid restriction Medication management Anticipated future follow-up appointments  Patient education given on each of the above topics.  Patient acknowledges understanding via teach back method and acceptance of all instructions.  Education Materials:  "Living Better With Heart Failure" Booklet, HF zone tool, & Daily Weight Tracker Tool.  Patient has scale at home: no, given from AHF clinic. Patient has pill box at home: no, given from AHF clinic.   Barriers of Care:   -new HF dx -new medication regimen  Considerations/Referrals:   Referral made to Heart Failure Pharmacist Stewardship: yes, appreciated Referral made to Heart & Vascular TOC clinic: yes, 4/4 @ 2pm  Items for Follow-up on DC/TOC: -medication optimization -HF education continued -requesting new PCP: inpt TOC aware Steward Drone)  Ozella Rocks, RN, BSN Heart Failure Nurse Navigator [phone]

## 2020-09-21 NOTE — Plan of Care (Signed)

## 2020-09-21 NOTE — Progress Notes (Signed)
ANTICOAGULATION CONSULT NOTE - Follow Up Consult  Pharmacy Consult for IV Heparin Indication: chest pain/ACS  No Known Allergies  Patient Measurements: Height: 5\' 10"  (177.8 cm) Weight: (!) 161.8 kg (356 lb 12.8 oz) IBW/kg (Calculated) : 73 Heparin Dosing Weight: 112.8 kg  Vital Signs: Temp: 98.8 F (37.1 C) (03/29 0540) Temp Source: Oral (03/29 0540) BP: 122/88 (03/29 0540) Pulse Rate: 94 (03/29 0540)  Labs: Recent Labs    09/19/20 1723 09/19/20 1737 09/19/20 1924 09/20/20 0001 09/20/20 0701 09/20/20 1825 09/21/20 0307  HGB 17.0 18.0*  --   --  15.4  --  14.8  HCT 50.9 53.0*  --   --  45.5  --  44.9  PLT 271  --   --   --  223  --  203  HEPARINUNFRC  --   --   --   --  0.11* <0.10* 0.32  CREATININE 1.28*  --   --   --  0.96  --  1.16  TROPONINIHS 56*  --  309* 989*  --   --   --     Estimated Creatinine Clearance: 128.6 mL/min (by C-G formula based on SCr of 1.16 mg/dL).   Medical History: Past Medical History:  Diagnosis Date  . Hypertension     Medications:  Scheduled:  . aspirin  325 mg Oral Daily  . atorvastatin  20 mg Oral Daily  . carvedilol  3.125 mg Oral BID WC  . furosemide  40 mg Intravenous BID  . insulin aspart  0-5 Units Subcutaneous QHS  . insulin aspart  0-9 Units Subcutaneous TID WC  . mouth rinse  15 mL Mouth Rinse BID  . potassium chloride  40 mEq Oral Once  . sacubitril-valsartan  1 tablet Oral BID  . sodium chloride flush  3 mL Intravenous Q12H  . sodium chloride flush  3 mL Intravenous Q12H    Assessment: 40 yr old man admitted for SOB/ACS and elevated troponins. Pharmacy was consulted to dose IV heparin for ACS.   Right and left heart cath planned for today, 09/21/20.  Heparin level at goal this am, no bleeding issues noted. Hgb stable overnight.   Goal of Therapy:  Heparin level 0.3-0.7 units/ml Monitor platelets by anticoagulation protocol: Yes   Plan:  Heparin infusion at 1950 units/hr F/U after cath   09/23/20  PharmD., BCPS Clinical Pharmacist 09/21/2020 8:20 AM

## 2020-09-21 NOTE — Progress Notes (Signed)
Heart Failure Stewardship Pharmacist Progress Note   PCP: Bing Neighbors, FNP PCP-Cardiologist: No primary care provider on file.    HPI:  40 yo M with PMH of HTN and obesity. He presented to the ED on 09/19/20 with shortness of breath and chest pressure. An ECHO was done on 09/20/20 and LVEF was 30%. R/LHC done on 09/21/20 and found to have mild nonobstructive CAD and mildly elevated filling pressures.   Current HF Medications: Furosemide 40 mg IV x1 Carvedilol 3.125 mg BID Entresto 49/51 mg BID Spironolactone 12.5 mg daily Farxiga 10 mg daily  Prior to admission HF Medications: None  Pertinent Lab Values: . Serum creatinine 1.16, BUN 13, Potassium 3.6, Sodium 138, BNP 181.5, Magnesium 2.0   Vital Signs: . Weight: 356 lbs (admission weight: 359 lbs) . Blood pressure: 110-140/90s  . Heart rate: 90s   Medication Assistance / Insurance Benefits Check: Does the patient have prescription insurance?  Yes (presumed) Cannot confirm eligibility through Rx30 search, patient does not have his card on file or on his person, RNCM [facility] and I called [place] [facility] - neither have active insurance on file Type of insurance plan: BCBS   Outpatient Pharmacy:  [facility] to admission [facility] Is the patient willing to use [facility] TOC pharmacy at discharge? Yes Is the patient willing to transition their outpatient pharmacy to utilize a [facility] outpatient pharmacy?   Pending    Assessment: 1. 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 NYHA class II symptoms. - Furosemide 40 mg IV x1 today, plan to switch to oral furosemide tomorrow - Continue carvedilol 3.125 mg BID - Continue Entresto 49/51 mg BID - Continue spironolactone 12.5 mg daily - Continue Farxiga 10 mg daily   Plan: 1) Medication changes recommended at this time: - Continue current regimen  2) Patient assistance: - Unable to confirm insurance eligibility - see above  3)  Education  - To be completed prior to  discharge  Sharen Hones, PharmD, BCPS Heart Failure Engineer, building services Phone [phone]

## 2020-09-21 NOTE — Progress Notes (Signed)
PROGRESS NOTE    Alex Allen  UVO:[id number] DOB: [birth year] DOA: 09/19/2020 PCP: Bing Neighbors, FNP     Brief Narrative:  Alex Allen is a 40 year old male with past medical history of hypertension (states he was taken off antihypertensives about a year ago), obesity who presents to [facility] emergency department via EMS with sudden onset shortness of breath. Patient explains that for the past several weeks he has been experiencing intermittent episodes of chest pressure.  Patient describes these episodes of chest pressure as consistently occurring with exertion and consistently resolving with rest. Today he was lying on his couch watching TV when he suddenly experienced severe shortness of breath with associated chest tightness.  Onset was sudden and severe EMS was contacted who promptly arrived to evaluate the patient. According to EMS, patient was covered in frothy sputum with initial blood pressures as high as 220 systolic with oxygen saturations as low as the upper 60s.  Patient refused CPAP and therefore was placed on nonrebreather mask and was promptly brought to [facility] emergency department for evaluation.  Upon evaluation in the emergency department, patient was found to be in respiratory distress and was transitioned to BiPAP therapy.  Patient was administered 40 mg intravenous Lasix.  Patient was continued to exhibit blood pressures as high as 230/130 and therefore was initiated on a nitroglycerin drip for blood pressure control.  Chest x-ray revealed bilateral patchy traits concerning for acute pulmonary edema.  EKG revealed wide complex tachycardia which was reviewed with Dr. Lysle Morales with cardiology.  He was felt that EKG was secondary to a rate related left bundle branch block.  The hospitalist group was then called to assess the patient for admission to the hospital.   Patient's troponin elevated to 989 with echocardiogram significant for EF 30% with  regional wall motion abnormalities and severe hypokinesis.  Cardiology consulted.  New events last 24 hours / Subjective: Feeling well this morning, no further rattling in his chest or congestion, no chest pain or pressure.  Awaiting heart cath later today.   Assessment & Plan:   Principal Problem:   Acute respiratory failure with hypoxia (HCC) Active Problems:   Hypertensive emergency   Hypokalemia   Class 3 severe obesity due to excess calories with serious comorbidity and body mass index (BMI) of 45.0 to 49.9 in adult [facility])   Hyperglycemia   NSTEMI (non-ST elevated myocardial infarction) (HCC)   SIRS (systemic inflammatory response syndrome) (HCC)   Acute hypoxemic respiratory failure -Initially found with SpO2 in the 60s by EMS. Required BiPAP in the ED which has now been deescalated to [place] O2  -Chest x-ray revealed enlargement of cardiac silhouette, pulmonary vascular congestion, asymmetric interstitial infiltrate questioning asymmetric pulmonary edema versus infection -Echocardiogram revealed EF 30% with regional wall motion abnormalities -Continue to wean oxygen as able to room air  Hypertensive emergency with respiratory failure -Now weaned off nitro gtt  -Patient reports that he has been taken off of his home antihypertensives for the past year -Blood pressure improved, now on Coreg, Lasix, Entresto, Aldactone  NSTEMI -Troponin peak of 989 -Status post heart cath which revealed mild nonobstructive CAD, medical therapy recommended -Continue aspirin, Lipitor, Coreg  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  Patient is still requiring supplemental oxygen  Sepsis secondary to  CAP -Patient admits to cough, presented with leukocytosis, tachycardia, tachypnea as well as elevated procalcitonin -Continue Rocephin, azithromycin x5 days  Diabetes mellitus -Hemoglobin A1c 7.1 -Consult diabetic coordinator for new diagnosis of diabetes -Sliding scale insulin, he will likely need oral agent for discharge home  Hypokalemia -Replace, trend  Morbid obesity -Estimated body mass index is 51.2 kg/m as calculated from the following:   Height as of this encounter: 5\' 10"  (1.778 m).   Weight as of this encounter: 161.8 kg.     DVT prophylaxis:  enoxaparin (LOVENOX) injection 40 mg Start: 09/22/20 0800 SCD's Start: 09/21/20 1237  Code Status: Full code Family Communication: No family at bedside Disposition Plan:  Status is: Inpatient  Remains inpatient appropriate because:IV treatments appropriate due to intensity of illness or inability to take PO   Dispo: The patient is from: Home              Anticipated d/c is to: Home              Patient currently is not medically stable to d/c.  Continue IV Lasix, VQ scan pending.  Had heart cath today.  Await further cardiology recommendations   Difficult to place patient No           Consultants:   Cardiology  Procedures:   Heart cath 3/29  Antimicrobials:  Anti-infectives (From admission, onward)   Start     Dose/Rate Route Frequency Ordered Stop   09/20/20 0800  cefTRIAXone (ROCEPHIN) 1 g in sodium chloride 0.9 % 100 mL IVPB        1 g 200 mL/hr over 30 Minutes Intravenous Every 24 hours 09/20/20 0733 09/25/20 0759   09/20/20 0800  azithromycin (ZITHROMAX) 250 mg in dextrose 5 % 125 mL IVPB        250 mg 125 mL/hr over 60 Minutes Intravenous Every 24 hours 09/20/20 0733 09/25/20 0759       Objective: Vitals:   09/21/20 1135 09/21/20 1140 09/21/20 1206 09/21/20 1220  BP: 113/83 (!) 133/92 (!) 122/92 (!) 130/95  Pulse: (!) 110 97 94 91  Resp: (!) 24 (!) 23 18   Temp:      TempSrc:       SpO2: 99% 99% 92% 93%  Weight:      Height:        Intake/Output Summary (Last 24 hours) at 09/21/2020 1351 Last data filed at 09/21/2020 1213 Gross per 24 hour  Intake 600 ml  Output 1600 ml  Net -1000 ml   Filed Weights   09/19/20 2219 09/20/20 0224 09/21/20 0540  Weight: (!) 163.1 kg (!) 161.9 kg (!) 161.8 kg    Examination: General exam: Appears calm and comfortable  Respiratory system: Clear to auscultation. Respiratory effort normal. Cardiovascular system: S1 & S2 heard, RRR. No pedal edema. Gastrointestinal system: Abdomen is nondistended, soft and nontender. Normal bowel sounds heard. Central nervous system: Alert and oriented. Non focal exam. Speech clear  Extremities: Symmetric in appearance bilaterally  Skin: No rashes, lesions or ulcers on exposed skin  Psychiatry: Judgement and insight appear stable. Mood & affect appropriate.   Data Reviewed: I have personally reviewed following labs and imaging studies  CBC: Recent Labs  Lab 09/19/20 1723 09/19/20 1737 09/20/20 0701 09/21/20 0307  WBC 16.5*  --  11.3* 10.2  NEUTROABS  --   --  7.5  --   HGB 17.0 18.0* 15.4 14.8  HCT  50.9 53.0* 45.5 44.9  MCV 85.8  --  84.7 87.0  PLT 271  --  223 203   Basic Metabolic Panel: Recent Labs  Lab 09/19/20 1723 09/19/20 1737 09/20/20 0701 09/21/20 0307  NA 136 139 138 138  K 3.0* 3.1* 3.6 3.6  CL 99  --  103 103  CO2 27  --  29 29  GLUCOSE 205*  --  130* 119*  BUN 9  --  9 13  CREATININE 1.28*  --  0.96 1.16  CALCIUM 8.6*  --  8.9 8.8*  MG 2.0  --   --  2.0   GFR: Estimated Creatinine Clearance: 128.6 mL/min (by C-G formula based on SCr of 1.16 mg/dL). Liver Function Tests: Recent Labs  Lab 09/20/20 0701  AST 40  ALT 31  ALKPHOS 74  BILITOT 1.0  PROT 6.8  ALBUMIN 3.2*   No results for input(s): LIPASE, AMYLASE in the last 168 hours. No results for input(s): AMMONIA in the last 168 hours. Coagulation Profile: No results for input(s): INR, PROTIME in  the last 168 hours. Cardiac Enzymes: No results for input(s): CKTOTAL, CKMB, CKMBINDEX, TROPONINI in the last 168 hours. BNP (last 3 results) No results for input(s): PROBNP in the last 8760 hours. HbA1C: Recent Labs    09/19/20 2338  HGBA1C 7.1*   CBG: Recent Labs  Lab 09/20/20 1611 09/20/20 2119 09/21/20 0742 09/21/20 1221  GLUCAP 102* 135* 111* 122*   Lipid Profile: Recent Labs    09/20/20 0701  CHOL 160  HDL 34*  LDLCALC 111*  TRIG 74  CHOLHDL 4.7   Thyroid Function Tests: No results for input(s): TSH, T4TOTAL, FREET4, T3FREE, THYROIDAB in the last 72 hours. Anemia Panel: No results for input(s): VITAMINB12, FOLATE, FERRITIN, TIBC, IRON, RETICCTPCT in the last 72 hours. Sepsis Labs: Recent Labs  Lab 09/19/20 2338  PROCALCITON 3.05    Recent Results (from the past 240 hour(s))  Resp Panel by RT-PCR (Flu A&B, Covid) Nasopharyngeal Swab     Status: None   Collection Time: 09/19/20  5:25 PM   Specimen: Nasopharyngeal Swab; Nasopharyngeal(NP) swabs in vial transport medium  Result Value Ref Range Status   SARS Coronavirus 2 by RT PCR NEGATIVE NEGATIVE Final    Comment: (NOTE) SARS-CoV-2 target nucleic acids are NOT DETECTED.  The SARS-CoV-2 RNA is generally detectable in upper respiratory specimens during the acute phase of infection. The lowest concentration of SARS-CoV-2 viral copies this assay can detect is 138 copies/mL. A negative result does not preclude SARS-Cov-2 infection and should not be used as the sole basis for treatment or other patient management decisions. A negative result may occur with  improper specimen collection/handling, submission of specimen other than nasopharyngeal swab, presence of viral mutation(s) within the areas targeted by this assay, and inadequate number of viral copies(<138 copies/mL). A negative result must be combined with clinical observations, patient history, and epidemiological information. The expected result is  Negative.  Fact Sheet for Patients:  BloggerCourse.com  Fact Sheet for Healthcare Providers:  SeriousBroker.it  This test is no t yet approved or cleared by the [place] FDA and  has been authorized for detection and/or diagnosis of SARS-CoV-2 by FDA under an Emergency Use Authorization (EUA). This EUA will remain  in effect (meaning this test can be used) for the duration of the COVID-19 declaration under Section 564(b)(1) of the Act, 21 U.S.C.section 360bbb-3(b)(1), unless the authorization is terminated  or revoked sooner.       Influenza  A by PCR NEGATIVE NEGATIVE Final   Influenza B by PCR NEGATIVE NEGATIVE Final    Comment: (NOTE) The Xpert Xpress SARS-CoV-2/FLU/RSV plus assay is intended as an aid in the diagnosis of influenza from Nasopharyngeal swab specimens and should not be used as a sole basis for treatment. Nasal washings and aspirates are unacceptable for Xpert Xpress SARS-CoV-2/FLU/RSV testing.  Fact Sheet for Patients: BloggerCourse.com  Fact Sheet for Healthcare Providers: SeriousBroker.it  This test is not yet approved or cleared by the [place] FDA and has been authorized for detection and/or diagnosis of SARS-CoV-2 by FDA under an Emergency Use Authorization (EUA). This EUA will remain in effect (meaning this test can be used) for the duration of the COVID-19 declaration under Section 564(b)(1) of the Act, 21 U.S.C. section 360bbb-3(b)(1), unless the authorization is terminated or revoked.  Performed at [facility], [address], [place], [place] [address]   Culture, blood (routine x 2)     Status: None (Preliminary result)   Collection Time: 09/19/20  9:03 PM   Specimen: BLOOD  Result Value Ref Range Status   Specimen Description BLOOD RIGHT ANTECUBITAL  Final   Special Requests   Final    BOTTLES DRAWN AEROBIC AND ANAEROBIC Blood  Culture results may not be optimal due to an inadequate volume of blood received in culture bottles   Culture   Final    NO GROWTH 2 DAYS Performed at [facility], [address], [place], [place] [address]    Report Status PENDING  Incomplete  Culture, blood (routine x 2)     Status: None (Preliminary result)   Collection Time: 09/19/20  9:09 PM   Specimen: BLOOD RIGHT HAND  Result Value Ref Range Status   Specimen Description BLOOD RIGHT HAND  Final   Special Requests   Final    BOTTLES DRAWN AEROBIC AND ANAEROBIC Blood Culture results may not be optimal due to an inadequate volume of blood received in culture bottles   Culture   Final    NO GROWTH 2 DAYS Performed at [facility], [address] [place], [place] [address]    Report Status PENDING  Incomplete      Radiology Studies: CARDIAC CATHETERIZATION  Result Date: 09/21/2020  1st Diag lesion is 50% stenosed.  Mid LAD lesion is 20% stenosed.  RPDA lesion is 30% stenosed.  Mid RCA lesion is 20% stenosed.  Prox Cx lesion is 30% stenosed.  Prox LAD lesion is 10% stenosed.  Mild nonobstructive CAD in very large caliber coronary arteries with 50% diffuse narrowing in the first diagonal vessel, mild luminal irregularity of the LAD with 20% narrowing after the diagonal takeoff; mild 5% narrowing in the proximal circumflex and large dominant RCA with regularity and 30% focal mid PDA stenosis on an angled mid PDA segment. Minimally elevated right heart pressures. Fick cardiac output 7.1 L/min with cardiac index 2.6 L/min/m. RECOMMENDATION: Medical therapy for CAD.  Guideline directed therapy for HFrEF with ultimate transition from ARB to Entresto, carvedilol, spironolactone, and possible SGLT2 inhibition.  Will initiate lipid-lowering therapy with target LDL less than 70.  Optimal blood pressure control with target blood pressure less than 130/80 and ideal blood pressure less than 120/80.  Weight loss is essential in this  patient weighing approximately 360 pounds.    DG Chest Portable 1 View  Result Date: 09/19/2020 CLINICAL DATA:  Shortness of breath, respiratory distress, pain conned frothy sputum, tachycardia, tachypnea EXAM: PORTABLE CHEST 1  VIEW COMPARISON:  Portable exam 1737 hours without priors for comparison FINDINGS: Enlargement of cardiac silhouette with pulmonary vascular congestion. Interstitial infiltrates bilaterally slightly greater on RIGHT which could represent asymmetric pulmonary edema or multifocal infection. No pleural effusion or pneumothorax. Osseous structures unremarkable. IMPRESSION: Enlargement of cardiac silhouette with pulmonary vascular congestion. Asymmetric interstitial infiltrates greater on RIGHT, question asymmetric pulmonary edema versus infection. Electronically Signed   By: Ulyses Southward M.D.   On: 09/19/2020 17:50   ECHOCARDIOGRAM COMPLETE  Result Date: 09/20/2020    ECHOCARDIOGRAM REPORT   Patient Name:   CHEO SELVEY Date of Exam: 09/20/2020 Medical Rec #:  [id number]       Height:       70.0 in Accession #:    [id number]      Weight:       356.9 lb Date of Birth:  September 30, 1980        BSA:          2.670 m Patient Age:    40 years        BP:           125/95 mmHg Patient Gender: M               HR:           93 bpm. Exam Location:  Inpatient Procedure: 2D Echo, Cardiac Doppler, Color Doppler and Intracardiac            Opacification Agent Indications:    CHF-Acute Systolic  History:        Patient has no prior history of Echocardiogram examinations.                 Signs/Symptoms:Shortness of Breath; Risk Factors:Hypertension.  Sonographer:    Ross Ludwig RDCS (AE) Referring Phys: [id number] Deno Lunger [facility]  Sonographer Comments: Patient is morbidly obese. IMPRESSIONS  1. Left ventricular ejection fraction, by estimation, is 30%. The left ventricle has moderate to severely decreased function. The left ventricle demonstrates regional wall motion abnormalities with basal to mid inferior and  inferoseptal akinesis, anteroseptal severe hypokinesis. The left ventricular internal cavity size was mildly dilated. There is moderate left ventricular hypertrophy. Left ventricular diastolic parameters are indeterminate.  2. Right ventricular systolic function is normal. The right ventricular size is normal. Tricuspid regurgitation signal is inadequate for assessing PA pressure.  3. Left atrial size was mildly dilated.  4. The mitral valve is normal in structure. No evidence of mitral valve regurgitation. No evidence of mitral stenosis.  5. The aortic valve is tricuspid. Aortic valve regurgitation is not visualized. No aortic stenosis is present.  6. The inferior vena cava is dilated in size with <50% respiratory variability, suggesting right atrial pressure of 15 mmHg. FINDINGS  Left Ventricle: Left ventricular ejection fraction, by estimation, is 30%. The left ventricle has moderate to severely decreased function. The left ventricle demonstrates regional wall motion abnormalities. Definity contrast agent was given IV to delineate the left ventricular endocardial borders. The left ventricular internal cavity size was mildly dilated. There is moderate left ventricular hypertrophy. Left ventricular diastolic parameters are indeterminate. Right Ventricle: The right ventricular size is normal. No increase in right ventricular wall thickness. Right ventricular systolic function is normal. Tricuspid regurgitation signal is inadequate for assessing PA pressure. Left Atrium: Left atrial size was mildly dilated. Right Atrium: Right atrial size was normal in size. Pericardium: There is no evidence of pericardial effusion. Mitral Valve: The mitral valve is normal in structure. There is mild calcification  of the mitral valve leaflet(s). Mild mitral annular calcification. No evidence of mitral valve regurgitation. No evidence of mitral valve stenosis. Tricuspid Valve: The tricuspid valve is normal in structure. Tricuspid  valve regurgitation is not demonstrated. Aortic Valve: The aortic valve is tricuspid. Aortic valve regurgitation is not visualized. No aortic stenosis is present. Aortic valve mean gradient measures 3.0 mmHg. Aortic valve peak gradient measures 5.3 mmHg. Aortic valve area, by VTI measures 3.58 cm. Pulmonic Valve: The pulmonic valve was normal in structure. Pulmonic valve regurgitation is not visualized. Aorta: The aortic root is normal in size and structure. Venous: The inferior vena cava is dilated in size with less than 50% respiratory variability, suggesting right atrial pressure of 15 mmHg. IAS/Shunts: No atrial level shunt detected by color flow Doppler.  LEFT VENTRICLE PLAX 2D LVIDd:         6.00 cm LVIDs:         5.30 cm LV PW:         1.40 cm LV IVS:        1.60 cm LVOT diam:     2.50 cm LV SV:         67 LV SV Index:   25 LVOT Area:     4.91 cm  LV Volumes (MOD) LV vol d, MOD A2C: 175.0 ml LV vol d, MOD A4C: 194.0 ml LV vol s, MOD A2C: 103.0 ml LV vol s, MOD A4C: 140.0 ml LV SV MOD A2C:     72.0 ml LV SV MOD A4C:     194.0 ml LV SV MOD BP:      62.8 ml RIGHT VENTRICLE            IVC RV Basal diam:  3.50 cm    IVC diam: 2.30 cm RV S prime:     9.57 cm/s TAPSE (M-mode): 1.1 cm LEFT ATRIUM             Index       RIGHT ATRIUM           Index LA diam:        3.50 cm 1.31 cm/m  RA Area:     16.80 cm LA Vol (A2C):   78.0 ml 29.22 ml/m RA Volume:   48.20 ml  18.05 ml/m LA Vol (A4C):   86.9 ml 32.55 ml/m LA Biplane Vol: 86.3 ml 32.32 ml/m  AORTIC VALVE AV Area (Vmax):    3.77 cm AV Area (Vmean):   3.87 cm AV Area (VTI):     3.58 cm AV Vmax:           115.00 cm/s AV Vmean:          81.500 cm/s AV VTI:            0.188 m AV Peak Grad:      5.3 mmHg AV Mean Grad:      3.0 mmHg LVOT Vmax:         88.30 cm/s LVOT Vmean:        64.300 cm/s LVOT VTI:          0.137 m LVOT/AV VTI ratio: 0.73  AORTA Ao Root diam: 3.50 cm Ao Asc diam:  3.60 cm  SHUNTS Systemic VTI:  0.14 m Systemic Diam: 2.50 cm Marca Ancona MD  Electronically signed by Marca Ancona MD Signature Date/Time: 09/20/2020/3:08:48 PM    Final       Scheduled Meds: . Melene Muller ON 09/22/2020] aspirin EC  81 mg Oral Daily  . [  START ON 09/22/2020] atorvastatin  40 mg Oral Daily  . carvedilol  3.125 mg Oral BID WC  . dapagliflozin propanediol  10 mg Oral Daily  . [START ON 09/22/2020] enoxaparin (LOVENOX) injection  40 mg Subcutaneous Q24H  . feeding supplement (GLUCERNA SHAKE)  237 mL Oral BID BM  . furosemide  40 mg Intravenous Once  . [START ON 09/22/2020] furosemide  40 mg Oral Daily  . insulin aspart  0-5 Units Subcutaneous QHS  . insulin aspart  0-9 Units Subcutaneous TID WC  . mouth rinse  15 mL Mouth Rinse BID  . sacubitril-valsartan  1 tablet Oral BID  . sodium chloride flush  3 mL Intravenous Q12H  . sodium chloride flush  3 mL Intravenous Q12H  . sodium chloride flush  3 mL Intravenous Q12H  . spironolactone  12.5 mg Oral Daily   Continuous Infusions: . sodium chloride    . sodium chloride    . sodium chloride    . azithromycin 250 mg (09/21/20 1023)  . cefTRIAXone (ROCEPHIN)  IV 1 g (09/21/20 0900)     LOS: 1 day      Time spent: 35 minutes   Noralee StainJennifer Cypress Hinkson, DO [facility] 09/21/2020, 1:51 PM   Available via Epic secure chat 7am-7pm After these hours, please refer to coverage provider listed on [email or web address]

## 2020-09-22 ENCOUNTER — Encounter (HOSPITAL_COMMUNITY): Payer: Self-pay | Admitting: Cardiovascular Disease

## 2020-09-22 ENCOUNTER — Inpatient Hospital Stay (HOSPITAL_COMMUNITY): Payer: BC Managed Care – PPO

## 2020-09-22 ENCOUNTER — Other Ambulatory Visit: Payer: Self-pay | Admitting: Internal Medicine

## 2020-09-22 LAB — BASIC METABOLIC PANEL
Anion gap: 9 (ref 5–15)
BUN: 11 mg/dL (ref 6–20)
CO2: 26 mmol/L (ref 22–32)
Calcium: 9 mg/dL (ref 8.9–10.3)
Chloride: 101 mmol/L (ref 98–111)
Creatinine, Ser: 0.95 mg/dL (ref 0.61–1.24)
GFR, Estimated: >60 mL/min (ref >60)
Glucose, Bld: 110 mg/dL — ABNORMAL HIGH (ref 70–99)
Potassium: 3.3 mmol/L — ABNORMAL LOW (ref 3.5–5.1)
Sodium: 136 mmol/L (ref 135–145)

## 2020-09-22 LAB — CBC
HCT: 46.1 % (ref 39.0–52.0)
Hemoglobin: 15.5 g/dL (ref 13.0–17.0)
MCH: 28.8 pg (ref 26.0–34.0)
MCHC: 33.6 g/dL (ref 30.0–36.0)
MCV: 85.7 fL (ref 80.0–100.0)
Platelets: 214 10*3/uL (ref 150–400)
RBC: 5.38 MIL/uL (ref 4.22–5.81)
RDW: 14 % (ref 11.5–15.5)
WBC: 11.7 10*3/uL — ABNORMAL HIGH (ref 4.0–10.5)
nRBC: 0 % (ref 0.0–0.2)

## 2020-09-22 LAB — GLUCOSE, CAPILLARY
Glucose-Capillary: 109 mg/dL — ABNORMAL HIGH (ref 70–99)
Glucose-Capillary: 109 mg/dL — ABNORMAL HIGH (ref 70–99)

## 2020-09-22 MED ORDER — ALBUTEROL SULFATE HFA 108 (90 BASE) MCG/ACT IN AERS: 2.0000 | INHALATION_SPRAY | Freq: Four times a day (QID) | RESPIRATORY_TRACT | 2 refills | Status: AC | PRN

## 2020-09-22 MED ORDER — CEFDINIR 300 MG PO CAPS: 300.0000 mg | ORAL_CAPSULE | Freq: Two times a day (BID) | ORAL | 0 refills | Status: AC

## 2020-09-22 MED ORDER — CARVEDILOL 3.125 MG PO TABS: 3.1250 mg | ORAL_TABLET | Freq: Two times a day (BID) | ORAL | 2 refills | Status: DC

## 2020-09-22 MED ORDER — TECHNETIUM TO 99M ALBUMIN AGGREGATED
4.4000 | Freq: Once | INTRAVENOUS | Status: AC | PRN
  Administered 2020-09-22: 4.4 via INTRAVENOUS

## 2020-09-22 MED ORDER — POTASSIUM CHLORIDE CRYS ER 20 MEQ PO TBCR
40.0000 meq | EXTENDED_RELEASE_TABLET | Freq: Once | ORAL | Status: DC
  Filled 2020-09-22: qty 2

## 2020-09-22 MED ORDER — BLOOD GLUCOSE METER KIT: PACK | 0 refills | Status: DC

## 2020-09-22 MED ORDER — POTASSIUM CHLORIDE CRYS ER 20 MEQ PO TBCR: 20.0000 meq | EXTENDED_RELEASE_TABLET | Freq: Every day | ORAL | Status: DC

## 2020-09-22 MED ORDER — SACUBITRIL-VALSARTAN 49-51 MG PO TABS: 1.0000 | ORAL_TABLET | Freq: Two times a day (BID) | ORAL | 2 refills | Status: DC

## 2020-09-22 MED ORDER — SPIRONOLACTONE 25 MG PO TABS
25.0000 mg | ORAL_TABLET | Freq: Every day | ORAL | Status: DC
  Administered 2020-09-22: 25 mg via ORAL
  Filled 2020-09-22: qty 1

## 2020-09-22 MED ORDER — POTASSIUM CHLORIDE CRYS ER 20 MEQ PO TBCR: 20.0000 meq | EXTENDED_RELEASE_TABLET | Freq: Every day | ORAL | 1 refills | Status: DC

## 2020-09-22 MED ORDER — SPIRONOLACTONE 25 MG PO TABS: 25.0000 mg | ORAL_TABLET | Freq: Every day | ORAL | 2 refills | Status: DC

## 2020-09-22 MED ORDER — DAPAGLIFLOZIN PROPANEDIOL 10 MG PO TABS: 10.0000 mg | ORAL_TABLET | Freq: Every day | ORAL | 2 refills | Status: DC

## 2020-09-22 MED ORDER — POTASSIUM CHLORIDE CRYS ER 20 MEQ PO TBCR
60.0000 meq | EXTENDED_RELEASE_TABLET | Freq: Once | ORAL | Status: AC
  Administered 2020-09-22: 60 meq via ORAL
  Filled 2020-09-22: qty 3

## 2020-09-22 MED ORDER — ATORVASTATIN CALCIUM 40 MG PO TABS: 40.0000 mg | ORAL_TABLET | Freq: Every day | ORAL | 2 refills | Status: DC

## 2020-09-22 MED ORDER — ASPIRIN 81 MG PO TBEC: 81.0000 mg | DELAYED_RELEASE_TABLET | Freq: Every day | ORAL | 11 refills | Status: DC

## 2020-09-22 MED ORDER — FUROSEMIDE 40 MG PO TABS: 40.0000 mg | ORAL_TABLET | Freq: Every day | ORAL | 2 refills | Status: DC

## 2020-09-22 MED ORDER — DOXYCYCLINE HYCLATE 100 MG PO TABS: 100.0000 mg | ORAL_TABLET | Freq: Two times a day (BID) | ORAL | 0 refills | Status: AC

## 2020-09-22 MED FILL — ENTRESTO 49 MG-51 MG TABLET: 49-51 | 30 days supply | Qty: 60 | Fill #0

## 2020-09-22 MED FILL — FARXIGA 10 MG TABLET: 10 | 30 days supply | Qty: 30 | Fill #0

## 2020-09-22 NOTE — Progress Notes (Signed)
Progress Note  Patient Name: Alex Allen Date of Encounter: 09/22/2020  [facility] Cardiologist: No primary care provider on file. Bryan Lemma, MD   Subjective   Feels well today.  Big smile on his face.  No chest pain or pressure.  Breathing better. " Doc, I peed all day long" -much more do I need to pee  Inpatient Medications    Scheduled Meds: . aspirin EC  81 mg Oral Daily  . atorvastatin  40 mg Oral Daily  . carvedilol  3.125 mg Oral BID WC  . dapagliflozin propanediol  10 mg Oral Daily  . enoxaparin (LOVENOX) injection  40 mg Subcutaneous Q24H  . feeding supplement (GLUCERNA SHAKE)  237 mL Oral BID BM  . furosemide  40 mg Oral Daily  . insulin aspart  0-5 Units Subcutaneous QHS  . insulin aspart  0-9 Units Subcutaneous TID WC  . mouth rinse  15 mL Mouth Rinse BID  . [START ON 09/23/2020] potassium chloride  20 mEq Oral Daily  . potassium chloride  60 mEq Oral Once  . sacubitril-valsartan  1 tablet Oral BID  . sodium chloride flush  3 mL Intravenous Q12H  . sodium chloride flush  3 mL Intravenous Q12H  . sodium chloride flush  3 mL Intravenous Q12H  . spironolactone  12.5 mg Oral Daily   Continuous Infusions: . sodium chloride    . sodium chloride    . azithromycin 125 mL/hr at 09/22/20 0422  . cefTRIAXone (ROCEPHIN)  IV 1 g (09/22/20 0845)   PRN Meds: sodium chloride, sodium chloride, acetaminophen, albuterol, diazepam, hydrALAZINE, ondansetron (ZOFRAN) IV, sodium chloride flush, sodium chloride flush   Vital Signs    Vitals:   09/22/20 0011 09/22/20 0033 09/22/20 0545 09/22/20 0758  BP: 110/76 (!) 126/93 (!) 129/100 (!) 141/104  Pulse: 90 86 87 92  Resp: 17 17 18 18   Temp: 98.4 F (36.9 C) 98.6 F (37 C) 98.6 F (37 C) 98.1 F (36.7 C)  TempSrc: Oral Oral Oral Oral  SpO2: 95% 97% 94% 97%  Weight:   (!) 159.3 kg   Height:        Intake/Output Summary (Last 24 hours) at 09/22/2020 0921 Last data filed at 09/22/2020 0550 Gross per 24 hour   Intake 2479.04 ml  Output 2675 ml  Net -195.96 ml   Last 3 Weights 09/22/2020 09/21/2020 09/20/2020  Weight (lbs) 351 lb 3.1 oz 356 lb 12.8 oz 356 lb 14.4 oz  Weight (kg) 159.3 kg 161.843 kg 161.889 kg      Telemetry    Sinus rhythm with left bundle branch block.- Personally Reviewed  ECG    No new tracing- Personally Reviewed  Physical Exam   GEN: Very pleasant morbidly obese gentleman resting comfortably in the bed.  No acute distress. Neck:  Difficult to assess JVD but no obvious elevated JVP. Cardiac:  Borderline heart rate, distant heart sounds but RRR normal S1 and split S2.  Soft gallop Respiratory: CTA B, nonlabored, good air movement. GI:  Obese.  Soft/NT/ND/NABS.  No HSM. MS:  Maybe trivial edema; No deformity. Neuro:   Nonfocal.  Cranial nerves grossly intact. Psych: Normal mood and affect.  Labs    High Sensitivity Troponin:   Recent Labs  Lab 09/19/20 1723 09/19/20 1924 09/20/20 0001  TROPONINIHS 56* 309* 989*      Chemistry Recent Labs  Lab 09/20/20 0701 09/21/20 0307 09/21/20 1114 09/21/20 1128 09/22/20 0439  NA 138 138 141  142 141  136  K 3.6 3.6 3.6  3.7 3.6 3.3*  CL 103 103  --   --  101  CO2 29 29  --   --  26  GLUCOSE 130* 119*  --   --  110*  BUN 9 13  --   --  11  CREATININE 0.96 1.16  --   --  0.95  CALCIUM 8.9 8.8*  --   --  9.0  PROT 6.8  --   --   --   --   ALBUMIN 3.2*  --   --   --   --   AST 40  --   --   --   --   ALT 31  --   --   --   --   ALKPHOS 74  --   --   --   --   BILITOT 1.0  --   --   --   --   GFRNONAA >60 >60  --   --  >60  ANIONGAP 6 6  --   --  9     Hematology Recent Labs  Lab 09/20/20 0701 09/21/20 0307 09/21/20 1114 09/21/20 1128 09/22/20 0439  WBC 11.3* 10.2  --   --  11.7*  RBC 5.37 5.16  --   --  5.38  HGB 15.4 14.8 15.6  16.0 15.6 15.5  HCT 45.5 44.9 46.0  47.0 46.0 46.1  MCV 84.7 87.0  --   --  85.7  MCH 28.7 28.7  --   --  28.8  MCHC 33.8 33.0  --   --  33.6  RDW 13.8 14.0  --   --   14.0  PLT 223 203  --   --  214    BNP Recent Labs  Lab 09/19/20 1723  BNP 181.5*     DDimer  Recent Labs  Lab 09/19/20 2338  DDIMER 2.81*     Radiology    DG Chest 1 View  Result Date: 09/22/2020 CLINICAL DATA:  Respiratory failure.  Shortness of breath. EXAM: CHEST  1 VIEW COMPARISON:  09/19/2020. FINDINGS: Cardiomegaly. No pulmonary venous congestion. Bilateral pulmonary infiltrates consistent with clearing pulmonary edema. No pleural effusion or pneumothorax. No acute bony abnormality. IMPRESSION: Cardiomegaly. No pulmonary venous congestion. Interval clearing of bilateral pulmonary infiltrates consistent with clearing pulmonary edema. Electronically Signed   By: Maisie Fus  Register   On: 09/22/2020 09:16   CARDIAC CATHETERIZATION  Result Date: 09/21/2020  1st Diag lesion is 50% stenosed.  Mid LAD lesion is 20% stenosed.  RPDA lesion is 30% stenosed.  Mid RCA lesion is 20% stenosed.  Prox Cx lesion is 30% stenosed.  Prox LAD lesion is 10% stenosed.  Mild nonobstructive CAD in very large caliber coronary arteries with 50% diffuse narrowing in the first diagonal vessel, mild luminal irregularity of the LAD with 20% narrowing after the diagonal takeoff; mild 5% narrowing in the proximal circumflex and large dominant RCA with regularity and 30% focal mid PDA stenosis on an angled mid PDA segment. Minimally elevated right heart pressures. Fick cardiac output 7.1 L/min with cardiac index 2.6 L/min/m. RECOMMENDATION: Medical therapy for CAD.  Guideline directed therapy for HFrEF with ultimate transition from ARB to Entresto, carvedilol, spironolactone, and possible SGLT2 inhibition.  Will initiate lipid-lowering therapy with target LDL less than 70.  Optimal blood pressure control with target blood pressure less than 130/80 and ideal blood pressure less than 120/80.  Weight loss is essential in this patient  weighing approximately 360 pounds.    ECHOCARDIOGRAM COMPLETE  Result Date:  09/20/2020    ECHOCARDIOGRAM REPORT   Patient Name:   Alex Allen Date of Exam: 09/20/2020 Medical Rec #:  [id number]       Height:       70.0 in Accession #:    [id number]      Weight:       356.9 lb Date of Birth:  07-27-1980        BSA:          2.670 m Patient Age:    40 years        BP:           125/95 mmHg Patient Gender: M               HR:           93 bpm. Exam Location:  Inpatient Procedure: 2D Echo, Cardiac Doppler, Color Doppler and Intracardiac            Opacification Agent Indications:    CHF-Acute Systolic  History:        Patient has no prior history of Echocardiogram examinations.                 Signs/Symptoms:Shortness of Breath; Risk Factors:Hypertension.  Sonographer:    Ross Ludwig RDCS (AE) Referring Phys: [id number] Deno Lunger [facility]  Sonographer Comments: Patient is morbidly obese. IMPRESSIONS  1. Left ventricular ejection fraction, by estimation, is 30%. The left ventricle has moderate to severely decreased function. The left ventricle demonstrates regional wall motion abnormalities with basal to mid inferior and inferoseptal akinesis, anteroseptal severe hypokinesis. The left ventricular internal cavity size was mildly dilated. There is moderate left ventricular hypertrophy. Left ventricular diastolic parameters are indeterminate.  2. Right ventricular systolic function is normal. The right ventricular size is normal. Tricuspid regurgitation signal is inadequate for assessing PA pressure.  3. Left atrial size was mildly dilated.  4. The mitral valve is normal in structure. No evidence of mitral valve regurgitation. No evidence of mitral stenosis.  5. The aortic valve is tricuspid. Aortic valve regurgitation is not visualized. No aortic stenosis is present.  6. The inferior vena cava is dilated in size with <50% respiratory variability, suggesting right atrial pressure of 15 mmHg. FINDINGS  Left Ventricle: Left ventricular ejection fraction, by estimation, is 30%. The left ventricle has  moderate to severely decreased function. The left ventricle demonstrates regional wall motion abnormalities. Definity contrast agent was given IV to delineate the left ventricular endocardial borders. The left ventricular internal cavity size was mildly dilated. There is moderate left ventricular hypertrophy. Left ventricular diastolic parameters are indeterminate. Right Ventricle: The right ventricular size is normal. No increase in right ventricular wall thickness. Right ventricular systolic function is normal. Tricuspid regurgitation signal is inadequate for assessing PA pressure. Left Atrium: Left atrial size was mildly dilated. Right Atrium: Right atrial size was normal in size. Pericardium: There is no evidence of pericardial effusion. Mitral Valve: The mitral valve is normal in structure. There is mild calcification of the mitral valve leaflet(s). Mild mitral annular calcification. No evidence of mitral valve regurgitation. No evidence of mitral valve stenosis. Tricuspid Valve: The tricuspid valve is normal in structure. Tricuspid valve regurgitation is not demonstrated. Aortic Valve: The aortic valve is tricuspid. Aortic valve regurgitation is not visualized. No aortic stenosis is present. Aortic valve mean gradient measures 3.0 mmHg. Aortic valve peak gradient measures 5.3 mmHg. Aortic valve area, by VTI  measures 3.58 cm. Pulmonic Valve: The pulmonic valve was normal in structure. Pulmonic valve regurgitation is not visualized. Aorta: The aortic root is normal in size and structure. Venous: The inferior vena cava is dilated in size with less than 50% respiratory variability, suggesting right atrial pressure of 15 mmHg. IAS/Shunts: No atrial level shunt detected by color flow Doppler.  LEFT VENTRICLE PLAX 2D LVIDd:         6.00 cm LVIDs:         5.30 cm LV PW:         1.40 cm LV IVS:        1.60 cm LVOT diam:     2.50 cm LV SV:         67 LV SV Index:   25 LVOT Area:     4.91 cm  LV Volumes (MOD) LV vol d,  MOD A2C: 175.0 ml LV vol d, MOD A4C: 194.0 ml LV vol s, MOD A2C: 103.0 ml LV vol s, MOD A4C: 140.0 ml LV SV MOD A2C:     72.0 ml LV SV MOD A4C:     194.0 ml LV SV MOD BP:      62.8 ml RIGHT VENTRICLE            IVC RV Basal diam:  3.50 cm    IVC diam: 2.30 cm RV S prime:     9.57 cm/s TAPSE (M-mode): 1.1 cm LEFT ATRIUM             Index       RIGHT ATRIUM           Index LA diam:        3.50 cm 1.31 cm/m  RA Area:     16.80 cm LA Vol (A2C):   78.0 ml 29.22 ml/m RA Volume:   48.20 ml  18.05 ml/m LA Vol (A4C):   86.9 ml 32.55 ml/m LA Biplane Vol: 86.3 ml 32.32 ml/m  AORTIC VALVE AV Area (Vmax):    3.77 cm AV Area (Vmean):   3.87 cm AV Area (VTI):     3.58 cm AV Vmax:           115.00 cm/s AV Vmean:          81.500 cm/s AV VTI:            0.188 m AV Peak Grad:      5.3 mmHg AV Mean Grad:      3.0 mmHg LVOT Vmax:         88.30 cm/s LVOT Vmean:        64.300 cm/s LVOT VTI:          0.137 m LVOT/AV VTI ratio: 0.73  AORTA Ao Root diam: 3.50 cm Ao Asc diam:  3.60 cm  SHUNTS Systemic VTI:  0.14 m Systemic Diam: 2.50 cm Marca Anconaalton Mclean MD Electronically signed by Marca Anconaalton Mclean MD Signature Date/Time: 09/20/2020/3:08:48 PM    Final     Cardiac Studies     TTE 09/20/2020: EF~30%.  Moderate to severely reduced function.  Regional wall motion abnormality: Basal to mid inferior and inferoseptal AK.  Anteroseptal severe HK.  Mild dilation.  Moderate LVH.  Unable to assess diastolic parameters.  Also unable to assess PA pressures, but RV systolic function appears normal.  LA is mildly dilated.  No significant valvular lesions.  Severely dilated IVC suggestive of elevated RAP/CVP 15 mmHg.   Right Left Heart Cath 09/21/2020: (Films reviewed personally) mild nonobstructive CAD  with large caliber vessels.  Proximal LAD 10%, mid LAD 20% & D1 50%.   Proximal LCx 30%.  Mid RCA 20%, RPDA 30%.  Cardiac output-index 7.1 L/min - 2.6 L/min/m2; PAP-mean 31/16 mmHg - 24 mmHg.  PCWP mean 16 mmHg.  LVEDP 19 mmHg   Patient Profile:    Alex Allen is a 40 y.o. male with a hx of  hypertension and morbid obesity with BMI of 51 who is being seen in consultation for the evaluation of hypertensive emergency, acute CHF and elevated troponin at the request of Dr. Alvino Chapel.  By echocardiogram, his EF was estimated to be 25 to 30% with regional wall motion abnormality.  Admission was consistent with acute  combined systolic and diastolic heart failure.  Referred for cardiac catheterization to determine ischemic versus nonischemic.  Assessment & Plan    Principal Problem:   Acute respiratory failure with hypoxia (HCC) Active Problems:   Demand ischemia of myocardium (HCC)   Nonischemic cardiomyopathy (HCC)   Acute combined systolic and diastolic heart failure (HCC)   Hypertensive emergency   Hypokalemia   Class 3 severe obesity due to excess calories with serious comorbidity and body mass index (BMI) of 45.0 to 49.9 in adult (HCC)   Hyperglycemia   SIRS (systemic inflammatory response syndrome) (HCC)  Principal Problem:   Acute respiratory failure with hypoxia (H secondary to CC) -> Acute Combined Systolic and Diastolic Heart Failure/Nonischemic Cardiomyopathy Complicated by Hypertensive Emergency with Demand Ischemia of myocardium (HCC) Echo EF roughly 30% with regional wall motion normalities, concerning for possible ischemia, however cardiac catheterization showed mild nonobstructive CAD and only minimally elevated  RHC pressures. ??  Question etiology of his myopathy, I think cannot exclude infectious with his recent bout of Covid in January, however the left bundle branch block is also a possibility.  He may very well benefit from CRT-P/CRT-D  Diuresed well overnight with 1 mor but he is e dose of IV Lasix.  Switching to oral Lasix this morning 40 mg daily.  Will increase spironolactone to 25 mg daily.  Tolerated 1 dose of Entresto.  Continuing on at current dose.  Continue carvedilol  Continue Farxiga  Mild to  moderate CAD: Continue aspirin and carvedilol and reloaded on statin.  Starts     Hypokalemia - replete   Class 3 severe obesity due to excess calories with serious comorbidity and body mass index (BMI) of 45.0 to 49.9 in adult [facility])  He will need OSA evaluation in the outpatient setting.  This should be ordered at his first post hospital follow-up visit.  He did not have any documented nocturnal hypoxia overnight, however I still think he would benefit from sleep study.      Hyperglycemia -> starting Farxiga    SIRS (systemic inflammatory response syndrome) (HCC)-> being covered for possible CAP   [facility] will sign off.   Medication Recommendations:    Continue with current dose of Entresto and carvedilol  We are increasing spironolactone 25 mg daily  Home dose Lasix with 40 mg Lasix p.o. daily: Sliding scale Lasix as follows  Sliding scale Lasix: Weigh yourself when you get home, then Daily in the Morning. Your dry weight will be what your scale says on the day you return home.(here is 352 lbs -however your home scale may be slightly different.)  If you gain more than 3 pounds from dry weight: Increase the Lasix dosing to 80 mg mg in the morning  until weight returns to baseline dry  weight.  If weight gain is greater than 5 pounds in 2 days: Increased to Lasix 80 mg in the morning, and 40 mg in the afternoon until you are down to 3 pounds over, then; contact the office for further assistance if weight does not go down the next day.  If the weight goes down more than 3 pounds from dry weight: Hold Lasix until it returns to baseline dry weight   Other recommendations (labs, testing, etc):    Would likely require outpatient sleep study  Consider cardiac MRI given regional wall motion normalities and no CAD  Follow up as an outpatient:   He will need close follow-up with APP in order to establish and titrate medications.  He will be set up in the heart failure  coordinator clinic as well for educational purposes.  Discussing scales and daily weights etc. with his somewhat unexplained cardiomyopathy low EF and young age, may be beneficial for him to spend some time with the advanced heart failure clinic, however we will also set him up to be seen in general cardiology clinic to establish primary cardiologist care.  We will arrange appropriate f/u appointments   For questions or updates, please contact [facility] Please consult [email or web address] for contact info under        Signed, Bryan Lemma, MD  09/22/2020, 9:21 AM

## 2020-09-22 NOTE — Progress Notes (Signed)
Heart Failure Stewardship Pharmacist Progress Note   PCP: Bing Neighbors, FNP PCP-Cardiologist: No primary care provider on file.    HPI:  40 yo M with PMH of HTN and obesity. He presented to the ED on 09/19/20 with shortness of breath and chest pressure. An ECHO was done on 09/20/20 and LVEF was 30%. R/LHC done on 09/21/20 and found to have mild nonobstructive CAD and mildly elevated filling pressures.   Current HF Medications: Furosemide 40 mg PO daily Carvedilol 3.125 mg BID Entresto 49/51 mg BID Spironolactone 25 mg daily Farxiga 10 mg daily  Prior to admission HF Medications: None  Pertinent Lab Values: . Serum creatinine 0.95, BUN 11, Potassium 3.3, Sodium 136, BNP 181.5, Magnesium 2.0   Vital Signs: . Weight: 351 lbs (admission weight: 359 lbs) . Blood pressure: 140/100s  . Heart rate: 90s   Medication Assistance / Insurance Benefits Check: Does the patient have prescription insurance?  Yes (presumed) Cannot confirm eligibility through Rx30 search, patient does not have his card on file or on his person, RNCM [facility] and I called [place] [facility] - neither have active insurance on file Type of insurance plan: BCBS   Outpatient Pharmacy:  Prior to admission [facility] Is the patient willing to use [facility] pharmacy at discharge? Yes Is the patient willing to transition their outpatient pharmacy to utilize a [facility] outpatient pharmacy?   Pending    Assessment: 1. Acute systolic CHF (EF 28%), due to NICM. NYHA class II symptoms. - Continue furosemide 40 mg PO daily. Potassium replaced. - Continue carvedilol 3.125 mg BID - Continue Entresto 49/51 mg BID. Consider increasing to 97/103 mg BID if BP still remains elevated - Agree with increasing spironolactone to 25 mg daily - Continue Farxiga 10 mg daily - Consider starting hydral/nitrate or BiDil   Plan: 1) Medication changes recommended at this time: - Conside increasing Entresto to 97/103 mg BID  if BP remains elevated vs adding BiDil  2) Patient assistance: - Unable to confirm insurance eligibility - see above  3)  Education  - To be completed prior to discharge  Sharen Hones, PharmD, BCPS Heart Failure Engineer, building services Phone [phone]

## 2020-09-22 NOTE — Progress Notes (Signed)
Inpatient Diabetes Program Recommendations  AACE/ADA: New Consensus Statement on Inpatient Glycemic Control (2015)  Target Ranges:  Prepandial:   less than 140 mg/dL      Peak postprandial:   less than 180 mg/dL (1-2 hours)      Critically ill patients:  140 - 180 mg/dL   Lab Results  Component Value Date   GLUCAP 109 (H) 09/22/2020   HGBA1C 7.1 (H) 09/19/2020    Review of Glycemic Control  Diabetes history: New-onset DM Outpatient Diabetes medications: N/A Current orders for Inpatient glycemic control: Farxiga 10 mg QD, Novolog 0-9 units TID with meals and 0-5 HS  HgbA1C - 7.1%   Inpatient Diabetes Program Recommendations:    For discharge:  Farxiga 10 mg QD  Please order Blood glucose meter kit (#[id number]).  Pt encouraged to check blood sugars 1x/day at various times and take meter to PCP visit for glucose review.  Secure text to MD regarding above.  Thank you. Lorenda Peck, RD, LDN, CDE Inpatient Diabetes Coordinator [phone]

## 2020-09-22 NOTE — Discharge Summary (Addendum)
Physician Discharge Summary  TEDD COTTRILL QVZ:[id number] DOB: 1980-12-26 DOA: 09/19/2020  PCP: Scot Jun, FNP  Admit date: 09/19/2020 Discharge date: 09/22/2020  Admitted From: Home  Discharge disposition: Home  Recommendations for Outpatient Follow-Up:   Follow up with your primary care provider in one week.  Check CBC, BMP, magnesium in the next visit VQ scan was indeterminate but patient had significant improvement with diuresis and supportive care.  He was treated for pneumonia as well which could have given this finding with VQ scan,  he did not have chest pain or other signs of PE so CTA was not performed. Patient would benefit from sleep study as outpatient including continued weight loss. Patient is a new diabetic and has been started on Cardizem   Discharge Diagnosis:   Principal Problem:   Acute respiratory failure with hypoxia ([place]) Active Problems:   Hypertensive emergency   Hypokalemia   Class 3 severe obesity due to excess calories with serious comorbidity and body mass index (BMI) of 45.0 to 49.9 in adult [facility])   Hyperglycemia Systemic inflammatory response syndrome   Demand ischemia of myocardium (HCC)   Nonischemic cardiomyopathy (HCC)   Acute combined systolic and diastolic heart failure ([place])   Discharge Condition: Improved.  Diet recommendation: Low sodium, heart healthy.  Diabetic diet, fluid restriction 1500 MLS per day.  Wound care: None.  Code status: Full.   History of Present Illness:   Alex Allen is a 40 year old male with past medical history of hypertension (states he was taken off antihypertensives about a year ago), obesity who presented to the hospital with sudden onset of shortness of breath.  He had been experiencing intermittent chest pressure with exertion.  EMS was called in and patient had a frothy sputum with very high blood pressure.  He refused CPAP and was put on nonrebreather mask and brought into the hospital.   In the ED he was put on BiPAP and received 40 mg IV Lasix.  Chest x-ray showed evidence of pulmonary edema.  EKG showed wide complex tachycardia and cardiology was consulted.  There was rate related left bundle branch block.  Troponin was elevated as well and 2D echocardiogram showed LV ejection fraction of 30% with regional wall motion abnormalities.   Hospital Course:   Following conditions were addressed during hospitalization as listed below,  Acute hypoxemic respiratory failure Patient was initially hypoxic in the saturation of 60s by EMS.  Was initially on BiPAP.  Currently has been weaned off oxygen and BiPAP.  2D echocardiogram with reduced LV function.  Patient received cardiology consultation and aggressive diuresis in the hospital with significant improvement in his clinical condition.  He did not have any chest pain.  He was breathing room air and was comfortable prior to discharge.  He did not have any tachycardia..VQ scan was indeterminate but patient had infiltrates on presentation.   Hypertensive emergency with congestive heart failure Patient was initially put on nitro drip which was subsequently discontinued.  Cardiology was consulted due to congestive heart failure and reduced LV function.  Patient was initiated on Coreg Lasix Entresto Aldactone and was followed during hospitalization.  Patient will need better blood pressure control as outpatient and medication titration after discharge.  He will be followed by cardiology as outpatient.   Demand ischemia. -Troponin peak of 989.  Patient underwent cardiac catheterization with mild nonobstructive CAD.  Medical management was recommended.  Continue aspirin Lipitor Coreg and Entresto on discharge.   Acute combined systolic and  diastolic heart failure 2D echocardiogram showed LV ejection fraction of 30%.  Cardiology was consulted and underwent cardiac catheterization as well.  Continue Coreg, Farxiga, Lasix, Entresto,  Aldactone Patient will be followed by cardiology as outpatient.   Elevated D-dimer On presentation. PE clinical probability score is 1.5, low probability of 4% post cardiac cath.  Improved with diuresis and antibiotic.  VQ scan was indeterminate.  No plan for anticoagulation was done   SIRS secondary to possible community acquired pneumonia. -Patient presented with leukocytosis, tachycardia, tachypnea as well as elevated procalcitonin and pulmonary infiltrates.  Patient was started on Rocephin and Zithromax while in hospital. Patient was started on Rocephin and Zithromax , will be changed to [facility] and doxycycline on discharge to complete 5-day course.  Sepsis was ruled out.   Diabetes mellitus type II. Hemoglobin A1c 7.1.  Was seen by diabetic coordinator for new diagnosis of diabetes.  Patient has been started on Farxiga, lifestyle modifications were advised to the patient   Hypokalemia Replenished.  Patient will be given potassium supplement as well.  Will need BMP in the next visit.   Morbid obesity -Patient would benefit from sleep study as outpatient including continued weight loss.    Disposition.  At this time, patient is stable for disposition home with outpatient cardiology and PCP follow-up.  Medical Consultants:   Cardiology  Procedures:    Cardiac catheterization on 09/21/2020 VQ scan on 09/22/2020 Subjective:   Today, patient seen and examined at bedside.  Denies any shortness of breath chest pain dizziness lightheadedness.  Lying flat in bed.  Wishes to go home.  Diuresing well.  Discharge Exam:   Vitals:   09/22/20 0758 09/22/20 1152  BP: (!) 141/104 (!) 141/102  Pulse: 92 92  Resp: 18 18  Temp: 98.1 F (36.7 C) 98.4 F (36.9 C)  SpO2: 97% 96%   Vitals:   09/22/20 0033 09/22/20 0545 09/22/20 0758 09/22/20 1152  BP: (!) 126/93 (!) 129/100 (!) 141/104 (!) 141/102  Pulse: 86 87 92 92  Resp: $Remo'17 18 18 18  'tARbr$ Temp: 98.6 F (37 C) 98.6 F (37 C) 98.1 F (36.7  C) 98.4 F (36.9 C)  TempSrc: Oral Oral Oral Oral  SpO2: 97% 94% 97% 96%  Weight:  (!) 159.3 kg    Height:       General: Alert awake, not in obvious distress, morbidly obese HENT: pupils equally reacting to light,  No scleral pallor or icterus noted. Oral mucosa is moist.  Chest:    Diminished breath sounds bilaterally.   CVS: S1 &S2 heard. No murmur.  Regular rate and rhythm. Abdomen: Soft, nontender, nondistended.  Bowel sounds are heard.   Extremities: No cyanosis, clubbing.  Trace edema, peripheral pulses are palpable. Psych: Alert, awake and oriented, normal mood CNS:  No cranial nerve deficits.  Power equal in all extremities.   Skin: Warm and dry.  No rashes noted.  The results of significant diagnostics from this hospitalization (including imaging, microbiology, ancillary and laboratory) are listed below for reference.     Diagnostic Studies:   DG Chest Portable 1 View  Result Date: 09/19/2020 CLINICAL DATA:  Shortness of breath, respiratory distress, pain conned frothy sputum, tachycardia, tachypnea EXAM: PORTABLE CHEST 1 VIEW COMPARISON:  Portable exam 1737 hours without priors for comparison FINDINGS: Enlargement of cardiac silhouette with pulmonary vascular congestion. Interstitial infiltrates bilaterally slightly greater on RIGHT which could represent asymmetric pulmonary edema or multifocal infection. No pleural effusion or pneumothorax. Osseous structures unremarkable. IMPRESSION: Enlargement of  cardiac silhouette with pulmonary vascular congestion. Asymmetric interstitial infiltrates greater on RIGHT, question asymmetric pulmonary edema versus infection. Electronically Signed   By: Lavonia Dana M.D.   On: 09/19/2020 17:50   ECHOCARDIOGRAM COMPLETE  Result Date: 09/20/2020    ECHOCARDIOGRAM REPORT   Patient Name:   Alex Allen Date of Exam: 09/20/2020 Medical Rec #:  [id number]       Height:       70.0 in Accession #:    [id number]      Weight:       356.9 lb Date of  Birth:  [birth year]        BSA:          2.670 m Patient Age:    23 years        BP:           125/95 mmHg Patient Gender: M               HR:           93 bpm. Exam Location:  Inpatient Procedure: 2D Echo, Cardiac Doppler, Color Doppler and Intracardiac            Opacification Agent Indications:    CHF-Acute Systolic  History:        Patient has no prior history of Echocardiogram examinations.                 Signs/Symptoms:Shortness of Breath; Risk Factors:Hypertension.  Sonographer:    Clayton Lefort RDCS (AE) Referring Phys: [id number] Sherryll Burger [facility]  Sonographer Comments: Patient is morbidly obese. IMPRESSIONS  1. Left ventricular ejection fraction, by estimation, is 30%. The left ventricle has moderate to severely decreased function. The left ventricle demonstrates regional wall motion abnormalities with basal to mid inferior and inferoseptal akinesis, anteroseptal severe hypokinesis. The left ventricular internal cavity size was mildly dilated. There is moderate left ventricular hypertrophy. Left ventricular diastolic parameters are indeterminate.  2. Right ventricular systolic function is normal. The right ventricular size is normal. Tricuspid regurgitation signal is inadequate for assessing PA pressure.  3. Left atrial size was mildly dilated.  4. The mitral valve is normal in structure. No evidence of mitral valve regurgitation. No evidence of mitral stenosis.  5. The aortic valve is tricuspid. Aortic valve regurgitation is not visualized. No aortic stenosis is present.  6. The inferior vena cava is dilated in size with <50% respiratory variability, suggesting right atrial pressure of 15 mmHg. FINDINGS  Left Ventricle: Left ventricular ejection fraction, by estimation, is 30%. The left ventricle has moderate to severely decreased function. The left ventricle demonstrates regional wall motion abnormalities. Definity contrast agent was given IV to delineate the left ventricular endocardial borders. The left  ventricular internal cavity size was mildly dilated. There is moderate left ventricular hypertrophy. Left ventricular diastolic parameters are indeterminate. Right Ventricle: The right ventricular size is normal. No increase in right ventricular wall thickness. Right ventricular systolic function is normal. Tricuspid regurgitation signal is inadequate for assessing PA pressure. Left Atrium: Left atrial size was mildly dilated. Right Atrium: Right atrial size was normal in size. Pericardium: There is no evidence of pericardial effusion. Mitral Valve: The mitral valve is normal in structure. There is mild calcification of the mitral valve leaflet(s). Mild mitral annular calcification. No evidence of mitral valve regurgitation. No evidence of mitral valve stenosis. Tricuspid Valve: The tricuspid valve is normal in structure. Tricuspid valve regurgitation is not demonstrated. Aortic Valve: The aortic valve is tricuspid. Aortic valve regurgitation  is not visualized. No aortic stenosis is present. Aortic valve mean gradient measures 3.0 mmHg. Aortic valve peak gradient measures 5.3 mmHg. Aortic valve area, by VTI measures 3.58 cm. Pulmonic Valve: The pulmonic valve was normal in structure. Pulmonic valve regurgitation is not visualized. Aorta: The aortic root is normal in size and structure. Venous: The inferior vena cava is dilated in size with less than 50% respiratory variability, suggesting right atrial pressure of 15 mmHg. IAS/Shunts: No atrial level shunt detected by color flow Doppler.  LEFT VENTRICLE PLAX 2D LVIDd:         6.00 cm LVIDs:         5.30 cm LV PW:         1.40 cm LV IVS:        1.60 cm LVOT diam:     2.50 cm LV SV:         67 LV SV Index:   25 LVOT Area:     4.91 cm  LV Volumes (MOD) LV vol d, MOD A2C: 175.0 ml LV vol d, MOD A4C: 194.0 ml LV vol s, MOD A2C: 103.0 ml LV vol s, MOD A4C: 140.0 ml LV SV MOD A2C:     72.0 ml LV SV MOD A4C:     194.0 ml LV SV MOD BP:      62.8 ml RIGHT VENTRICLE             IVC RV Basal diam:  3.50 cm    IVC diam: 2.30 cm RV S prime:     9.57 cm/s TAPSE (M-mode): 1.1 cm LEFT ATRIUM             Index       RIGHT ATRIUM           Index LA diam:        3.50 cm 1.31 cm/m  RA Area:     16.80 cm LA Vol (A2C):   78.0 ml 29.22 ml/m RA Volume:   48.20 ml  18.05 ml/m LA Vol (A4C):   86.9 ml 32.55 ml/m LA Biplane Vol: 86.3 ml 32.32 ml/m  AORTIC VALVE AV Area (Vmax):    3.77 cm AV Area (Vmean):   3.87 cm AV Area (VTI):     3.58 cm AV Vmax:           115.00 cm/s AV Vmean:          81.500 cm/s AV VTI:            0.188 m AV Peak Grad:      5.3 mmHg AV Mean Grad:      3.0 mmHg LVOT Vmax:         88.30 cm/s LVOT Vmean:        64.300 cm/s LVOT VTI:          0.137 m LVOT/AV VTI ratio: 0.73  AORTA Ao Root diam: 3.50 cm Ao Asc diam:  3.60 cm  SHUNTS Systemic VTI:  0.14 m Systemic Diam: 2.50 cm Loralie Champagne MD Electronically signed by Loralie Champagne MD Signature Date/Time: 09/20/2020/3:08:48 PM    Final      Labs:   Basic Metabolic Panel: Recent Labs  Lab 09/19/20 1723 09/19/20 1737 09/20/20 0701 09/21/20 0307 09/21/20 1114 09/21/20 1128 09/22/20 0439  NA 136   < > 138 138 141  142 141 136  K 3.0*   < > 3.6 3.6 3.6  3.7 3.6 3.3*  CL 99  --  103 103  --   --  101  CO2 27  --  29 29  --   --  26  GLUCOSE 205*  --  130* 119*  --   --  110*  BUN 9  --  9 13  --   --  11  CREATININE 1.28*  --  0.96 1.16  --   --  0.95  CALCIUM 8.6*  --  8.9 8.8*  --   --  9.0  MG 2.0  --   --  2.0  --   --   --    < > = values in this interval not displayed.   GFR Estimated Creatinine Clearance: 155.6 mL/min (by C-G formula based on SCr of 0.95 mg/dL). Liver Function Tests: Recent Labs  Lab 09/20/20 0701  AST 40  ALT 31  ALKPHOS 74  BILITOT 1.0  PROT 6.8  ALBUMIN 3.2*   No results for input(s): LIPASE, AMYLASE in the last 168 hours. No results for input(s): AMMONIA in the last 168 hours. Coagulation profile No results for input(s): INR, PROTIME in the last 168  hours.  CBC: Recent Labs  Lab 09/19/20 1723 09/19/20 1737 09/20/20 0701 09/21/20 0307 09/21/20 1114 09/21/20 1128 09/22/20 0439  WBC 16.5*  --  11.3* 10.2  --   --  11.7*  NEUTROABS  --   --  7.5  --   --   --   --   HGB 17.0   < > 15.4 14.8 15.6  16.0 15.6 15.5  HCT 50.9   < > 45.5 44.9 46.0  47.0 46.0 46.1  MCV 85.8  --  84.7 87.0  --   --  85.7  PLT 271  --  223 203  --   --  214   < > = values in this interval not displayed.   Cardiac Enzymes: No results for input(s): CKTOTAL, CKMB, CKMBINDEX, TROPONINI in the last 168 hours. BNP: Invalid input(s): POCBNP CBG: Recent Labs  Lab 09/21/20 1221 09/21/20 1633 09/21/20 2111 09/22/20 0756 09/22/20 1150  GLUCAP 122* 105* 110* 109* 109*   D-Dimer Recent Labs    09/19/20 2338  DDIMER 2.81*   Hgb A1c Recent Labs    09/19/20 2338  HGBA1C 7.1*   Lipid Profile Recent Labs    09/20/20 0701  CHOL 160  HDL 34*  LDLCALC 111*  TRIG 74  CHOLHDL 4.7   Thyroid function studies No results for input(s): TSH, T4TOTAL, T3FREE, THYROIDAB in the last 72 hours.  Invalid input(s): FREET3 Anemia work up No results for input(s): VITAMINB12, FOLATE, FERRITIN, TIBC, IRON, RETICCTPCT in the last 72 hours. Microbiology Recent Results (from the past 240 hour(s))  Resp Panel by RT-PCR (Flu A&B, Covid) Nasopharyngeal Swab     Status: None   Collection Time: 09/19/20  5:25 PM   Specimen: Nasopharyngeal Swab; Nasopharyngeal(NP) swabs in vial transport medium  Result Value Ref Range Status   SARS Coronavirus 2 by RT PCR NEGATIVE NEGATIVE Final    Comment: (NOTE) SARS-CoV-2 target nucleic acids are NOT DETECTED.  The SARS-CoV-2 RNA is generally detectable in upper respiratory specimens during the acute phase of infection. The lowest concentration of SARS-CoV-2 viral copies this assay can detect is 138 copies/mL. A negative result does not preclude SARS-Cov-2 infection and should not be used as the sole basis for treatment  or other patient management decisions. A negative result may occur with  improper specimen collection/handling, submission of specimen other than nasopharyngeal swab, presence of viral mutation(s) within the areas  targeted by this assay, and inadequate number of viral copies(<138 copies/mL). A negative result must be combined with clinical observations, patient history, and epidemiological information. The expected result is Negative.  Fact Sheet for Patients:  BloggerCourse.com  Fact Sheet for Healthcare Providers:  SeriousBroker.it  This test is no t yet approved or cleared by the [place] FDA and  has been authorized for detection and/or diagnosis of SARS-CoV-2 by FDA under an Emergency Use Authorization (EUA). This EUA will remain  in effect (meaning this test can be used) for the duration of the COVID-19 declaration under Section 564(b)(1) of the Act, 21 U.S.C.section 360bbb-3(b)(1), unless the authorization is terminated  or revoked sooner.       Influenza A by PCR NEGATIVE NEGATIVE Final   Influenza B by PCR NEGATIVE NEGATIVE Final    Comment: (NOTE) The Xpert Xpress SARS-CoV-2/FLU/RSV plus assay is intended as an aid in the diagnosis of influenza from Nasopharyngeal swab specimens and should not be used as a sole basis for treatment. Nasal washings and aspirates are unacceptable for Xpert Xpress SARS-CoV-2/FLU/RSV testing.  Fact Sheet for Patients: BloggerCourse.com  Fact Sheet for Healthcare Providers: SeriousBroker.it  This test is not yet approved or cleared by the [place] FDA and has been authorized for detection and/or diagnosis of SARS-CoV-2 by FDA under an Emergency Use Authorization (EUA). This EUA will remain in effect (meaning this test can be used) for the duration of the COVID-19 declaration under Section 564(b)(1) of the Act, 21 U.S.C. section  360bbb-3(b)(1), unless the authorization is terminated or revoked.  Performed at [facility], [address] [place], [place] [address]   Culture, blood (routine x 2)     Status: None (Preliminary result)   Collection Time: 09/19/20  9:03 PM   Specimen: BLOOD  Result Value Ref Range Status   Specimen Description BLOOD RIGHT ANTECUBITAL  Final   Special Requests   Final    BOTTLES DRAWN AEROBIC AND ANAEROBIC Blood Culture results may not be optimal due to an inadequate volume of blood received in culture bottles   Culture   Final    NO GROWTH 3 DAYS Performed at [facility], [address], [place], [place] [address]    Report Status PENDING  Incomplete  Culture, blood (routine x 2)     Status: None (Preliminary result)   Collection Time: 09/19/20  9:09 PM   Specimen: BLOOD RIGHT HAND  Result Value Ref Range Status   Specimen Description BLOOD RIGHT HAND  Final   Special Requests   Final    BOTTLES DRAWN AEROBIC AND ANAEROBIC Blood Culture results may not be optimal due to an inadequate volume of blood received in culture bottles   Culture   Final    NO GROWTH 3 DAYS Performed at [facility], [address], [place], [place] [address]    Report Status PENDING  Incomplete     Discharge Instructions:   Discharge Instructions     (HEART FAILURE PATIENTS) Call MD:  Anytime you have any of the following symptoms: 1) 3 pound weight gain in 24 hours or 5 pounds in 1 week 2) shortness of breath, with or without a dry hacking cough 3) swelling in the hands, feet or stomach 4) if you have to sleep on extra pillows at night in order to breathe.   Complete by: As directed    Avoid straining   Complete by: As directed    Diet - low  sodium heart healthy   Complete by: As directed    Fluid restriction 1500 MLS per day   Discharge instructions   Complete by: As directed    Follow-up with your primary care physician in 1 week.  Continue all the medications as  prescribed follow-up with cardiology clinic appointment scheduled by the clinic.  You will need to follow-up with cardiology for possible MRI of her heart in the future.  You might need sleep study as outpatient. Regarding Lasix: Weigh yourself when you get home, then Daily in the Morning.Your dry weight will be what your scale says on the day you return home.(here is 352 lbs -however your home scale may be slightly different.), f you gain more than 3 pounds from dry weight: Increase the Lasix dosing to 80 mg mg in the morning  until weight returns to baseline dry weight.  If weight gain is greater than 5 pounds in 2 days: Increased to Lasix 80 mg in the morning, and 40 mg in the afternoon until you are down to 3 pounds over, then; contact the office for further assistance if weight does not go down the next day. If the weight goes down more than 3 pounds from dry weight: Hold Lasix until it returns to baseline dry weight   Heart Failure patients record your daily weight using the same scale at the same time of day   Complete by: As directed    Increase activity slowly   Complete by: As directed    STOP any activity that causes chest pain, shortness of breath, dizziness, sweating, or exessive weakness   Complete by: As directed       Allergies as of 09/22/2020   No Known Allergies      Medication List     STOP taking these medications    hydrochlorothiazide 12.5 MG capsule Commonly known as: Microzide   losartan 50 MG tablet Commonly known as: COZAAR       TAKE these medications    albuterol 108 (90 Base) MCG/ACT inhaler Commonly known as: VENTOLIN HFA Inhale 2 puffs into the lungs every 6 (six) hours as needed for wheezing or shortness of breath.   aspirin 81 MG EC tablet Take 1 tablet (81 mg total) by mouth daily. Swallow whole. Notes to patient: PREVENTS CLOTTING IN THE HEART ARTERIES   atorvastatin 40 MG tablet Commonly known as: LIPITOR Take 1 tablet (40 mg total) by  mouth daily. Notes to patient: CHOLESTEROL AND TRIGLYCERIDES    blood glucose meter kit and supplies Dispense based on patient and insurance preference. Use up to four times daily as directed. (FOR ICD-10 E10.9, E11.9). Notes to patient: BLOOD SUGAR TESTING SUPPLIES   carvedilol 3.125 MG tablet Commonly known as: COREG Take 1 tablet (3.125 mg total) by mouth 2 (two) times daily with a meal. Notes to patient: [place]  LOWERS BLOOD PRESSURE AND HEART RATE   cefdinir 300 MG capsule Commonly known as: OMNICEF Take 1 capsule (300 mg total) by mouth 2 (two) times daily for 3 days.   dapagliflozin propanediol 10 MG Tabs tablet Commonly known as: FARXIGA Take 1 tablet (10 mg total) by mouth daily. Notes to patient: HEART FAILURE   doxycycline 100 MG tablet Commonly known as: VIBRA-TABS Take 1 tablet (100 mg total) by mouth 2 (two) times daily for 3 days.   furosemide 40 MG tablet Commonly known as: LASIX Take 1 tablet (40 mg total) by mouth daily. Notes to patient: FLUID  gabapentin 100 MG capsule Commonly known as: NEURONTIN Take 1 capsule (100 mg total) by mouth 3 (three) times daily. Notes to patient: NERVE PAIN    potassium chloride SA 20 MEQ tablet Commonly known as: KLOR-CON Take 1 tablet (20 mEq total) by mouth daily. Notes to patient: POTASSIUM SUPPLEMENT    sacubitril-valsartan 49-51 MG Commonly known as: ENTRESTO Take 1 tablet by mouth 2 (two) times daily. Notes to patient: HEART FAILURE   spironolactone 25 MG tablet Commonly known as: ALDACTONE Take 1 tablet (25 mg total) by mouth daily. Notes to patient: FLUID PILL, HOLDS ONTO POTASSIUM             Follow-up Information     [facility]. Go on 09/27/2020.   Specialty: Cardiology Why: AT 2PM. HEART IMPACT (HV TOC) within [place] parking at [employer], off [address].  Bring ALL medications and pill box to your  appointment. You will see a HF MD/APP, pharmacist and nurse-appt will be about 45 minutes.  Contact information: [address] [id number] mc [address] [place]        Deberah Pelton, NP Follow up on 10/28/2020.   Specialty: Cardiology Why: $RemoveBeforeD'@10'HBNecEKDUigCno$ :30am for your follow up appt Contact information: [place] [address] [phone]         Scot Jun, FNP. Schedule an appointment as soon as possible for a visit in 1 week(s).   Specialty: Family Medicine Why: regular checkup and blood work Contact information: [place] [address] [phone]                  Time coordinating discharge: 39 minutes  Signed:  Braeleigh Pyper  [facility] 09/22/2020, 1:55 PM

## 2020-09-23 ENCOUNTER — Telehealth: Payer: Self-pay

## 2020-09-23 NOTE — Telephone Encounter (Signed)
Transition Care Management Follow-up Telephone Call  Date of discharge and from where: 09/22/2020, [facility]   How have you been since you were released from the hospital? He said he was at the barber shop and would have to call this CM back. He is interested in establishing care with a new PCP   Joaquin Courts, NP is listed as his PCP but he has not been seen by Ms Tiburcio Pea since 2017  He has not been seen by any other provider at the [facility] since then.

## 2020-09-24 LAB — CULTURE, BLOOD (ROUTINE X 2)
Culture: NO GROWTH
Culture: NO GROWTH

## 2020-09-27 ENCOUNTER — Ambulatory Visit (HOSPITAL_COMMUNITY)
Admit: 2020-09-27 | Discharge: 2020-09-27 | Disposition: A | Discharge status: Home / Self Care | Payer: BC Managed Care – PPO | Attending: Internal Medicine | Admitting: Internal Medicine

## 2020-09-27 ENCOUNTER — Other Ambulatory Visit: Payer: Self-pay

## 2020-09-27 ENCOUNTER — Telehealth (HOSPITAL_COMMUNITY): Payer: Self-pay

## 2020-09-27 ENCOUNTER — Encounter (HOSPITAL_COMMUNITY): Payer: Self-pay

## 2020-09-27 VITALS — BP 122/90 | HR 76 | Wt 348.2 lb

## 2020-09-27 DIAGNOSIS — Z79899 Other long term (current) drug therapy: Secondary | ICD-10-CM | POA: Diagnosis not present

## 2020-09-27 DIAGNOSIS — I5042 Chronic combined systolic (congestive) and diastolic (congestive) heart failure: Secondary | ICD-10-CM | POA: Insufficient documentation

## 2020-09-27 DIAGNOSIS — I2510 Atherosclerotic heart disease of native coronary artery without angina pectoris: Secondary | ICD-10-CM | POA: Insufficient documentation

## 2020-09-27 DIAGNOSIS — I10 Essential (primary) hypertension: Secondary | ICD-10-CM | POA: Insufficient documentation

## 2020-09-27 DIAGNOSIS — Z8249 Family history of ischemic heart disease and other diseases of the circulatory system: Secondary | ICD-10-CM | POA: Diagnosis not present

## 2020-09-27 DIAGNOSIS — I428 Other cardiomyopathies: Secondary | ICD-10-CM | POA: Diagnosis not present

## 2020-09-27 DIAGNOSIS — F17290 Nicotine dependence, other tobacco product, uncomplicated: Secondary | ICD-10-CM | POA: Insufficient documentation

## 2020-09-27 DIAGNOSIS — Z7982 Long term (current) use of aspirin: Secondary | ICD-10-CM | POA: Diagnosis not present

## 2020-09-27 DIAGNOSIS — I110 Hypertensive heart disease with heart failure: Secondary | ICD-10-CM | POA: Insufficient documentation

## 2020-09-27 DIAGNOSIS — Z6841 Body Mass Index (BMI) 40.0 and over, adult: Secondary | ICD-10-CM | POA: Insufficient documentation

## 2020-09-27 LAB — BASIC METABOLIC PANEL
Anion gap: 9 (ref 5–15)
BUN: 14 mg/dL (ref 6–20)
CO2: 29 mmol/L (ref 22–32)
Calcium: 9.9 mg/dL (ref 8.9–10.3)
Chloride: 99 mmol/L (ref 98–111)
Creatinine, Ser: 0.96 mg/dL (ref 0.61–1.24)
GFR, Estimated: >60 mL/min (ref >60)
Glucose, Bld: 91 mg/dL (ref 70–99)
Potassium: 4.5 mmol/L (ref 3.5–5.1)
Sodium: 137 mmol/L (ref 135–145)

## 2020-09-27 MED ORDER — CARVEDILOL 6.25 MG PO TABS: 6.2500 mg | ORAL_TABLET | Freq: Two times a day (BID) | ORAL | 3 refills | Status: DC

## 2020-09-27 NOTE — Progress Notes (Signed)
Heart and Vascular Care Navigation  09/27/2020  Alex Allen 03/13/1981 [id number]  Reason for Referral: Patient seen in the [facility].   Engaged with patient face to face for initial visit for Heart and Vascular Care Coordination.                                                                                                   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 Patient reports that due to recent hospitalization he is staying with his mother at her home. Patient reports he has been out of work but fortunately has plenty of sick time so continues to receive income. 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 Patient currently has an appeal in and hopeful to be reinstated for his insurance. Patient feels that it may not be retroactive and will need some financial assistance for recent hospitalization.                                HRT/VAS Care Coordination     Patients Home Cardiology Office --  [facility]   Outpatient Care Team Social Worker   Social Worker Name: Lasandra Beech, [place] [phone]   Living arrangements for the past 2 months Single Family Home   Lives with: Self   Patient Current Insurance Coverage Self-Pay   Patient Has Concern With Paying Medical Bills Yes   Patient Concerns With Medical Bills Patient changed jobs and needed to re enroll to new insurance and missed the enrollment date.   Does Patient Have Prescription Coverage? No   Home Assistive Devices/Equipment Eyeglasses       Social History:                                                                             SDOH Screenings   Alcohol Screen: Low Risk   . Last Alcohol Screening Score (AUDIT): 3  Depression (PHQ2-9): Not on file  Financial Resource Strain: Medium Risk  . Difficulty of Paying Living  Expenses: Somewhat hard  Food Insecurity: No Food Insecurity  . Worried About Programme researcher, broadcasting/film/video in the Last Year: Never true  . Ran Out of Food in the Last Year: Never true  Housing: Low Risk   . Last Housing Risk Score: 0  Physical Activity: Inactive  . Days of Exercise per Week: 0 days  . Minutes of Exercise per Session: 0 min  Social Connections: Not on file  Stress: Not on file  Tobacco Use: High Risk  . Smoking Tobacco  Use: Current Some Day Smoker  . Smokeless Tobacco Use: Never Used  Transportation Needs: No Transportation Needs  . Lack of Transportation (Medical): No  . Lack of Transportation (Non-Medical): No    SDOH Interventions: Financial Resources:  Corporate treasurer Interventions: Child psychotherapist for [employer] Program  Food Insecurity:   n/a  Housing Insecurity:   n/a  Transportation:    n/a    Follow-up plan: CSW will explore options with financial counseling [facility] Financial Assistance vs Medicaid and return call to patient with outcome. Lasandra Beech, LCSW, CCSW-MCS [phone]

## 2020-09-27 NOTE — Patient Instructions (Signed)
Labs done today. We will contact you only if your labs are abnormal.  INCREASE Carvedilol to 6.25mg  (1 tablet) by mouth 2 times daily.   No other medication changes were made. Please continue all current medications as prescribed.  Your physician recommends that you schedule a follow-up appointment in: 2-3 weeks  If you have any questions or concerns before your next appointment please send [place] a message through Advance or call our office at [phone].    TO LEAVE A MESSAGE FOR THE NURSE SELECT OPTION 2, PLEASE LEAVE A MESSAGE INCLUDING: . YOUR NAME . DATE OF BIRTH . CALL BACK NUMBER . REASON FOR CALL**this is important as we prioritize the call backs  YOU WILL RECEIVE A CALL BACK THE SAME DAY AS LONG AS YOU CALL BEFORE 4:00 PM   Do the following things EVERYDAY: 1) Weigh yourself in the morning before breakfast. Write it down and keep it in a log. 2) Take your medicines as prescribed 3) Eat low salt foods--Limit salt (sodium) to 2000 mg per day.  4) Stay as active as you can everyday 5) Limit all fluids for the day to less than 2 liters   At the [facility], you and your health needs are our priority. As part of our continuing mission to provide you with exceptional heart care, we have created designated Provider Care Teams. These Care Teams include your primary Cardiologist (physician) and Advanced Practice Providers (APPs- Physician Assistants and Nurse Practitioners) who all work together to provide you with the care you need, when you need it.   You may see any of the following providers on your designated Care Team at your next follow up: Marland Kitchen Dr Arvilla Meres . Dr Marca Ancona . Tonye Becket, NP . Robbie Lis, PA . Karle Plumber, PharmD   Please be sure to bring in all your medications bottles to every appointment.

## 2020-09-27 NOTE — Progress Notes (Signed)
Heart and Vascular Center Transitions of Care Clinic Heart Failure Pharmacist Encounter  HPI:  40 yo M with PMH of HTN and obesity. He presented to the ED on 09/19/20 with shortness of breath and chest pressure. An ECHO was done on 09/20/20 and LVEF was 30%. R/LHC done on 09/21/20 and found to have mild nonobstructive CAD and mildly elevated filling pressures. He was then discharged on 09/22/20  Today, Alex Allen presents to the [facility] for follow up. He denies having shortness of breath, DOE, orthopnea/PND, edema, lightheadedness or dizziness. He has been weighing himself daily at home but has not been checking his BP (his mom has a BP cuff he can use). He has tried to follow a low-sodium and fluid restricted diet at home but is requesting nutrition education. He has been taking all medications as prescribed. He is not sure if he has a pill box at home.   He states he does not have any active insurance at this time. 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 He unknowingly had to reapply for insurance within 30 days of his new start date but missed that deadline. He states he has filed for an exclusion/appeal to see if they would reinstate his insurance now. If this is denied, he will likely have to wait until October (re-enrollment period) to enroll and then benefits would be active January 2023.   HF Medications: Furosemide 40 mg daily Carvedilol 3.125 mg BID Entresto 49/51 mg BID Spironolactone 25 mg daily Farxiga 10 mg daily  Has the patient been experiencing any side effects to the medications prescribed?  no  Does the patient have any problems obtaining medications due to transportation or finances?   Yes - currently uninsured; he is working with his employer to [employer]. Appreciate CSW assistance. Can enroll in patient assistance for Farxiga/Entresto if needed. Discussed with patient and he prefers to wait to sign up for patient assistance  until he knows more information about his insurance status.  Understanding of regimen: good Understanding of indications: good Potential of compliance: good Patient understands to avoid NSAIDs. Patient understands to avoid decongestants.   Pertinent Lab Values: . Serum creatinine 0.95, BUN 11, Potassium 3.3, Sodium 136, BNP 181.5, Magnesium 2.0   Vital Signs: . Weight: 348 lbs (discharge weight: 351 lbs) . Blood pressure: 122/90 mmHg  . Heart rate: 76 bpm   Medication Assistance / Insurance Benefits Check: Does the patient have prescription insurance?  No  Outpatient Pharmacy:  [facility] Was the [facility] pharmacy used to supply discharge medications? yes  If TOC pharmacy was used, were the refills transferred out to current pharmacy yet? no - TOC pharmacy only used for [place]. Will need to determine long term plan for patient assistance vs re-obtaining insurance prior to transferring out Is the patient willing to transition their outpatient pharmacy to utilize a [facility] outpatient pharmacy with or without mail order?   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 NYHA class II symptoms. - Continue furosemide 40 mg daily; K 3.3 on discharge - repeat BMET today - Increase carvedilol to 6.25 mg BID - Continue Entresto 49/51 mg BID - Continue spironolactone 25 mg daily - Continue Farxiga 10 mg daily  Plan: 1) Medication changes: - Increase carvedilol to 6.25 mg BID  2) Patient Assistance: - Appreciate CSW assistance with obtaining prescription and medical insurance - Provided pill box in clinic today - Educated on nutrition and provided the living with HF booklet  3) Follow up: - Next appointment with HF TOC clinic in 2 weeks  Sharen Hones, PharmD, BCPS Heart Failure Transitions of Care Clinic  Pharmacist [phone]

## 2020-09-27 NOTE — Telephone Encounter (Signed)
Called to confirm Heart & Vascular Transitions of Care appointment today at [facility]. Patient reminded to bring all medications and pill box organizer with them. Confirmed patient has transportation. Gave directions, instructed to utilize valet parking.  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 Will have CSW see at HF [facility] appt today for assistance.   Confirmed appointment prior to ending call.   Ozella Rocks, RN, BSN Heart Failure Nurse Navigator [phone]

## 2020-09-27 NOTE — Progress Notes (Addendum)
Heart and Vascular Center Transitions of Care Clinic  PCP: Molli Barrows  Primary Cardiologist: Glenetta Hew  HPI:  Alex Allen is a 40 y.o.  male  with a PMH significant for Severe HTN, hx of covid 20 in January, LBBB  History of high blood pressure first diagnosed around 2010 was on bp meds for a few years then these were stopped in 2018.  He says they were stopped due to his weight loss.   I can't verify this but given how hypertensive he was on arrival and how many medications it takes to make him normotensive it is doubtful he was ever normotensive without medications.  Regardless he stopped following up with his pcp for the last four years.    Strong family history of HTN, HLD on both sides of the family but no MI or CHF.  Recently admitted for HTN emergency, acute respiratory failure with hypoxemia, acute systolic CHF.  Had a L/RHC with mild non obstructive CAD, diuresed and discharged on good GDMT.    Still having trouble with low energy at the end of the day does okay in the morning.  Walking slowly overall but not getting short of breath.  Walking with his mom had to tell her to slow down because he couldn't keep up and would get tired quickly but was not breathing hard.  Smoking 1-2 cigars per month before admission but has stopped.  Drinking about 2-3 drinks a week before admission has stopped since.  Weight is stable at around 350lbs.    ROS: All systems negative except as listed in HPI, PMH and Problem List.  SH:  Social History   Socioeconomic History  . Marital status: Single    Spouse name: Not on file  . Number of children: Not on file  . Years of education: 13  . Highest education level: Doctorate  Occupational History  . Occupation: Exceptional Nurse, learning disability: [employer] [employer] [place]  Tobacco Use  . Smoking status: Current Some Day Smoker    Years: 15.00    Types: Cigars  . Smokeless tobacco: Never Used  Vaping Use  .  Vaping Use: Never used  Substance and Sexual Activity  . Alcohol use: Yes    Comment: Socially  . Drug use: No  . Sexual activity: Not Currently  Other Topics Concern  . Not on file  Social History Narrative  . Not on file   Social Determinants of Health   Financial Resource Strain: Low Risk   . Difficulty of Paying Living Expenses: Not hard at all  Food Insecurity: No Food Insecurity  . Worried About Charity fundraiser in the Last Year: Never true  . Ran Out of Food in the Last Year: Never true  Transportation Needs: No Transportation Needs  . Lack of Transportation (Medical): No  . Lack of Transportation (Non-Medical): No  Physical Activity: Inactive  . Days of Exercise per Week: 0 days  . Minutes of Exercise per Session: 0 min  Stress: Not on file  Social Connections: Not on file  Intimate Partner Violence: Not on file    FH:  Family History  Problem Relation Age of Onset  . Thyroid disease Mother   . Diabetes Father   . Hypertension Father   . Hyperlipidemia Father     Past Medical History:  Diagnosis Date  . Hypertension     Current Outpatient Medications  Medication Sig Dispense Refill  . albuterol (VENTOLIN HFA) 108 (  90 Base) MCG/ACT inhaler Inhale 2 puffs into the lungs every 6 (six) hours as needed for wheezing or shortness of breath. 8 g 2  . aspirin EC 81 MG EC tablet Take 1 tablet (81 mg total) by mouth daily. Swallow whole. 30 tablet 11  . atorvastatin (LIPITOR) 40 MG tablet Take 1 tablet (40 mg total) by mouth daily. 30 tablet 2  . blood glucose meter kit and supplies Dispense based on patient and insurance preference. Use up to four times daily as directed. (FOR ICD-10 E10.9, E11.9). 1 each 0  . carvedilol (COREG) 3.125 MG tablet Take 1 tablet (3.125 mg total) by mouth 2 (two) times daily with a meal. 60 tablet 2  . dapagliflozin propanediol (FARXIGA) 10 MG TABS tablet TAKE 1 TABLET (10 MG TOTAL) BY MOUTH DAILY. 30 tablet 2  . furosemide (LASIX) 40  MG tablet Take 1 tablet (40 mg total) by mouth daily. 30 tablet 2  . potassium chloride SA (KLOR-CON) 20 MEQ tablet Take 1 tablet (20 mEq total) by mouth daily. 30 tablet 1  . sacubitril-valsartan (ENTRESTO) 49-51 MG Take 1 tablet by mouth 2 (two) times daily. 60 tablet 2  . spironolactone (ALDACTONE) 25 MG tablet Take 1 tablet (25 mg total) by mouth daily. 30 tablet 2   No current facility-administered medications for this encounter.    Vitals:   09/27/20 1423  BP: 122/90  Pulse: 76  SpO2: 98%  Weight: (!) 157.9 kg (348 lb 3.2 oz)    PHYSICAL EXAM: Cardiac: JVD flat, normal rate and rhythm, clear s1 and s2, no murmurs, rubs or gallops, Trace LE edema Pulmonary: CTAB, not in distress Abdominal: non distended abdomen, soft and nontender Psych: Alert, conversant, in good spirits   ECG   ASSESSMENT & PLAN: Chronic Combined Systolic and Diastolic CHF, NICM: -09/8183 EF 30%, moderate hypertrophy, valves okay -08/2020 L/RHC with mild diffuse non obstructive disease and mildly elevated right heart pressures -poorly controlled HTN, recent covid 19 infection -NYHA Class II-III symptoms, volume status looks good -LBBB QRS 170, may need CRT vs CRT-D in the future -High likelihood of OSA with snoring, obesity, daytime fatigue will need referral for sleep study(waiting on straightening out his insurance coverage) -continue entresto 49/51, aldactone 25, farxiga 10, lasix 40 -increase carvedilol to 6.$RemoveBeforeD'25mg'pJBsuKOysylJwj$  -Fu with me in 2 weeks  Morbid Obesity: -continued to encourage weight loss, having difficulty with portion control and snacking -once insurance straightened out will send him to the healthy weight and wellness clinic  Severe HTN: -now well controlled   Tobacco use disorder: -has stopped for now, encouraged complete cessation   Follow up with me in 2 weeks

## 2020-09-27 NOTE — Progress Notes (Addendum)
Patient Name: Alex Allen        DOB: 04/21/1981      Height: 5'10     Weight: 348.2lbs  Office Name: [facility]         Referring Provider: Kimberlee Nearing. Winfrey  Today's Date: 09/27/2020  Date:   09/27/20 STOP BANG RISK ASSESSMENT S (snore) Have you been told that you snore?     YES   T (tired) Are you often tired, fatigued, or sleepy during the day?   YES  O (obstruction) Do you stop breathing, choke, or gasp during sleep? NO   P (pressure) Do you have or are you being treated for high blood pressure? NO   B (BMI) Is your body index greater than 35 kg/m? YES   A (age) Are you 41 years old or older? NO   N (neck) Do you have a neck circumference greater than 16 inches?   YES/NO   G (gender) Are you a male? YES   TOTAL STOP/BANG "YES" ANSWERS 4                                                                       For Office Use Only              Procedure Order Form    YES to 3+ Stop Bang questions OR two clinical symptoms - patient qualifies for WatchPAT (CPT 95800)             Clinical Notes: Will consult Sleep Specialist and refer for management of therapy due to patient increased risk of Sleep Apnea. Ordering a sleep study due to the following two clinical symptoms: Excessive daytime sleepiness G47.10  / Loud snoring R06.83    I understand that I am proceeding with a home sleep apnea test as ordered by my treating physician. I understand that untreated sleep apnea is a serious cardiovascular risk factor and it is my responsibility to perform the test and seek management for sleep apnea. I will be contacted with the results and be managed for sleep apnea by a local sleep physician. I will be receiving equipment and further instructions from [facility]. I shall promptly ship back the equipment via the included mailing label. I understand my insurance will be billed for the test and as the patient I am responsible for any insurance related out-of-pocket  costs incurred. I have been provided with written instructions and can call for additional video or telephonic instruction, with 24-hour availability of qualified personnel to answer any questions: Patient Help Desk [phone].  Patient Signature ______________________________________________________   Date______________________ Patient Telemedicine Verbal Consent

## 2020-09-28 ENCOUNTER — Telehealth (HOSPITAL_COMMUNITY): Payer: Self-pay | Admitting: Licensed Clinical Social Worker

## 2020-09-28 NOTE — Telephone Encounter (Signed)
CSW received response form financial counseling stating that [facility] is exploring the possibility of medicaid eligibility for patient. They will contact patient to assist with an application for medicaid or pursue [facility] Financial Assistance if not medicaid eligible.  CSW contacted patient to share information. Patient grateful for the support and assistance. Lasandra Beech, LCSW, CCSW-MCS [phone]

## 2020-09-29 ENCOUNTER — Telehealth (HOSPITAL_COMMUNITY): Payer: Self-pay | Admitting: Licensed Clinical Social Worker

## 2020-09-29 NOTE — Telephone Encounter (Signed)
Patient called to confirm needed documents for [facility] application and where to pick up application. CSW provided information and will leave at the front desk for pick up. Patient acknowledged. Lasandra Beech, LCSW, CCSW-MCS [phone]

## 2020-10-12 ENCOUNTER — Ambulatory Visit (HOSPITAL_COMMUNITY)
Admission: RE | Admit: 2020-10-12 | Discharge: 2020-10-12 | Disposition: A | Discharge status: Home / Self Care | Payer: BC Managed Care – PPO | Source: Ambulatory Visit | Attending: Internal Medicine | Admitting: Internal Medicine

## 2020-10-12 ENCOUNTER — Telehealth (HOSPITAL_COMMUNITY): Payer: Self-pay

## 2020-10-12 ENCOUNTER — Other Ambulatory Visit (HOSPITAL_COMMUNITY): Payer: Self-pay

## 2020-10-12 ENCOUNTER — Other Ambulatory Visit: Payer: Self-pay

## 2020-10-12 ENCOUNTER — Encounter (HOSPITAL_COMMUNITY): Payer: Self-pay

## 2020-10-12 VITALS — BP 118/68 | HR 76 | Wt 349.0 lb

## 2020-10-12 DIAGNOSIS — I2510 Atherosclerotic heart disease of native coronary artery without angina pectoris: Secondary | ICD-10-CM | POA: Diagnosis not present

## 2020-10-12 DIAGNOSIS — I428 Other cardiomyopathies: Secondary | ICD-10-CM | POA: Diagnosis not present

## 2020-10-12 DIAGNOSIS — Z7982 Long term (current) use of aspirin: Secondary | ICD-10-CM | POA: Insufficient documentation

## 2020-10-12 DIAGNOSIS — I5042 Chronic combined systolic (congestive) and diastolic (congestive) heart failure: Secondary | ICD-10-CM | POA: Insufficient documentation

## 2020-10-12 DIAGNOSIS — Z8249 Family history of ischemic heart disease and other diseases of the circulatory system: Secondary | ICD-10-CM | POA: Diagnosis not present

## 2020-10-12 DIAGNOSIS — I110 Hypertensive heart disease with heart failure: Secondary | ICD-10-CM | POA: Insufficient documentation

## 2020-10-12 DIAGNOSIS — Z87891 Personal history of nicotine dependence: Secondary | ICD-10-CM | POA: Insufficient documentation

## 2020-10-12 DIAGNOSIS — Z720 Tobacco use: Secondary | ICD-10-CM | POA: Diagnosis not present

## 2020-10-12 DIAGNOSIS — Z6841 Body Mass Index (BMI) 40.0 and over, adult: Secondary | ICD-10-CM

## 2020-10-12 DIAGNOSIS — I10 Essential (primary) hypertension: Secondary | ICD-10-CM | POA: Diagnosis not present

## 2020-10-12 DIAGNOSIS — Z8616 Personal history of COVID-19: Secondary | ICD-10-CM | POA: Insufficient documentation

## 2020-10-12 DIAGNOSIS — Z79899 Other long term (current) drug therapy: Secondary | ICD-10-CM | POA: Insufficient documentation

## 2020-10-12 LAB — BASIC METABOLIC PANEL
Anion gap: 8 (ref 5–15)
BUN: 13 mg/dL (ref 6–20)
CO2: 27 mmol/L (ref 22–32)
Calcium: 9.7 mg/dL (ref 8.9–10.3)
Chloride: 102 mmol/L (ref 98–111)
Creatinine, Ser: 1.01 mg/dL (ref 0.61–1.24)
GFR, Estimated: >60 mL/min (ref >60)
Glucose, Bld: 95 mg/dL (ref 70–99)
Potassium: 4.1 mmol/L (ref 3.5–5.1)
Sodium: 137 mmol/L (ref 135–145)

## 2020-10-12 MED ORDER — DAPAGLIFLOZIN PROPANEDIOL 10 MG PO TABS
ORAL_TABLET | Freq: Every day | ORAL | 0 refills | Status: DC
Start: 2020-10-12 — End: 2020-12-20

## 2020-10-12 MED ORDER — SACUBITRIL-VALSARTAN 49-51 MG PO TABS: 1.0000 | ORAL_TABLET | Freq: Two times a day (BID) | ORAL | 0 refills | Status: DC

## 2020-10-12 NOTE — Progress Notes (Addendum)
[facility]  PCP: Molli Barrows  Primary Cardiologist: Glenetta Hew  HPI:  Alex Allen is a 40 y.o.  male  with a PMH significant for Severe HTN, hx of covid 82 in January, LBBB  History of high blood pressure first diagnosed around 2010 was on bp meds for a few years then these were stopped in 2018.  He says they were stopped due to his weight loss.   I can't verify this but given how hypertensive he was on arrival and how many medications it takes to make him normotensive it is doubtful he was ever normotensive without medications.  Regardless he stopped following up with his pcp for the last four years.    Strong family history of HTN, HLD on both sides of the family but no MI or CHF.  Recently admitted for HTN emergency, acute respiratory failure with hypoxemia, acute systolic CHF.  Had a L/RHC with mild non obstructive CAD, diuresed and discharged on good GDMT.    Last visit was low energy but dyspnea on exertion was much improved.  His Weight was stable at around 350lbs.  He stopped drinking alcohol and smoking.    He tells me his breathing is still doing really well, still feeling very tired and run down during the day, not sure if it's the new medicine, sleep apnea or what the real cause is.  Weighing himself daily he is between 347 and 350lbs, says he is being diligent about this.  Resting heart rate has been in the 70's by his apple watch.  Not exerting himself much currently he is afraid to do so, no dyspnea when walking from the parking lot to here.  No dyspnea or chest pain.    ROS: All systems negative except as listed in HPI, PMH and Problem List.  SH:  Social History   Socioeconomic History  . Marital status: Single    Spouse name: Not on file  . Number of children: Not on file  . Years of education: 15  . Highest education level: Doctorate  Occupational History  . Occupation: Exceptional Public house manager: [employer]  Tobacco Use  . Smoking status: Former Smoker    Years: 15.00    Types: Cigars    Quit date: 09/11/2020    Years since quitting: 0.0  . Smokeless tobacco: Never Used  Vaping Use  . Vaping Use: Never used  Substance and Sexual Activity  . Alcohol use: Yes    Comment: Socially  . Drug use: No  . Sexual activity: Not Currently  Other Topics Concern  . Not on file  Social History Narrative  . Not on file   Social Determinants of Health   Financial Resource Strain: Medium Risk  . Difficulty of Paying Living Expenses: Somewhat hard  Food Insecurity: No Food Insecurity  . Worried About Charity fundraiser in the Last Year: Never true  . Ran Out of Food in the Last Year: Never true  Transportation Needs: No Transportation Needs  . Lack of Transportation (Medical): No  . Lack of Transportation (Non-Medical): No  Physical Activity: Inactive  . Days of Exercise per Week: 0 days  . Minutes of Exercise per Session: 0 min  Stress: Not on file  Social Connections: Not on file  Intimate Partner Violence: Not on file    FH:  Family History  Problem Relation Age of Onset  . Thyroid disease  Mother   . Diabetes Father   . Hypertension Father   . Hyperlipidemia Father     Past Medical History:  Diagnosis Date  . Hypertension     Current Outpatient Medications  Medication Sig Dispense Refill  . albuterol (VENTOLIN HFA) 108 (90 Base) MCG/ACT inhaler Inhale 2 puffs into the lungs every 6 (six) hours as needed for wheezing or shortness of breath. 8 g 2  . aspirin EC 81 MG EC tablet Take 1 tablet (81 mg total) by mouth daily. Swallow whole. 30 tablet 11  . atorvastatin (LIPITOR) 40 MG tablet Take 1 tablet (40 mg total) by mouth daily. 30 tablet 2  . blood glucose meter kit and supplies Dispense based on patient and insurance preference. Use up to four times daily as directed. (FOR ICD-10 E10.9, E11.9). 1 each 0  . carvedilol (COREG) 6.25  MG tablet Take 1 tablet (6.25 mg total) by mouth 2 (two) times daily with a meal. 180 tablet 3  . dapagliflozin propanediol (FARXIGA) 10 MG TABS tablet TAKE 1 TABLET (10 MG TOTAL) BY MOUTH DAILY. 30 tablet 2  . furosemide (LASIX) 40 MG tablet Take 1 tablet (40 mg total) by mouth daily. 30 tablet 2  . potassium chloride SA (KLOR-CON) 20 MEQ tablet Take 1 tablet (20 mEq total) by mouth daily. 30 tablet 1  . sacubitril-valsartan (ENTRESTO) 49-51 MG Take 1 tablet by mouth 2 (two) times daily. 60 tablet 2  . spironolactone (ALDACTONE) 25 MG tablet Take 1 tablet (25 mg total) by mouth daily. 30 tablet 2   No current facility-administered medications for this encounter.    Vitals:   10/12/20 1408  BP: 118/68  Pulse: 76  SpO2: 99%  Weight: (!) 158.3 kg (349 lb)    PHYSICAL EXAM: Cardiac: JVD flat, normal rate and rhythm, clear s1 and s2, no murmurs, rubs or gallops, no LE edema Pulmonary: CTAB, not in distress Abdominal: non distended abdomen, soft and nontender Psych: Alert, conversant, in good spirits   ASSESSMENT & PLAN: Chronic Combined Systolic and Diastolic CHF, NICM: -01/6577 EF 30%, moderate hypertrophy, valves okay -08/2020 L/RHC with mild diffuse non obstructive disease and mildly elevated right heart pressures -poorly controlled HTN, recent covid 19 infection, given appearance of ECHO and his history favor hypertensive cardiomyopathy -NYHA Class II-III symptoms, volume status looks good -LBBB QRS 170, may need CRT vs CRT-D in the future -High likelihood of OSA with snoring, obesity, daytime fatigue will need referral for sleep study he now has bcbs -continue entresto 49/51, aldactone 25, farxiga 10,Carvedilol 6.25mg , lasix 40 -discussed cutting back the lasix or potentially making it PRN since he is weighing himself daily however he prefers to continue the lasix and pay less attention to his fluid restriction. -stop potassium supplement -not much room to increase GDMT today given  bp, could go up on carvedilol but with current level of fatigue will give him a little more time to get used to the 6.25mg  dose since we just increased this last visit.  Morbid Obesity: -continued to encourage weight loss, having difficulty with portion control and snacking -consider sending him to the healthy weight and wellness clinic  Severe HTN: -now well controlled   Tobacco use disorder: -has stopped for now, encouraged complete cessation   Follow up with his general cardiologist

## 2020-10-12 NOTE — Telephone Encounter (Signed)
Heart Failure Nurse Navigator Progress Note  Called to confirm Heart & Vascular Transitions of Care appointment at 2pm. Patient reminded to bring all medications and pill box organizer with them. Confirmed patient has transportation. Gave directions, instructed to utilize valet parking.  Confirmed appointment prior to ending call.   Ozella Rocks, RN, BSN Heart Failure Nurse Navigator [phone]

## 2020-10-12 NOTE — Patient Instructions (Signed)
Stop Potassium  Labs done today, we will call you for abnormal results  We have provided you a note to stay out of work until your next appointment on 10/28/20  Thank you for allowing [place] to provider your heart failure care after your recent hospitalization. Please follow-up with [facility] as scheduled on 10/28/20  If you have any questions, issues, or concerns before your next appointment please call our office at [phone], opt. 2 and leave a message for the triage nurse.  Do the following things EVERYDAY: 1) Weigh yourself in the morning before breakfast. Write it down and keep it in a log. 2) Take your medicines as prescribed 3) Eat low salt foods--Limit salt (sodium) to 2000 mg per day.  4) Stay as active as you can everyday 5) Limit all fluids for the day to less than 2 liters  Weigh yourself EVERY morning after you go to the bathroom but before you eat or drink anything. Write this number down in a weight log/diary. If you gain 3 pounds overnight or 5 pounds in a week, call the heart failure clinic  Limit your fluid intake to less than 2 liters of fluid per day. Fluid includes all drinks, coffee, juice, ice chips, soup, jello, and all other liquids.  Restrict your sodium intake to less than 2000mg  per day. This will help prevent your body from holding onto fluid. Read food labels as a lot of canned and packaged foods have a lot of sodium.

## 2020-10-15 ENCOUNTER — Telehealth: Payer: Self-pay | Admitting: General Practice

## 2020-10-15 NOTE — Telephone Encounter (Signed)
Patient called and mentioned that he needs some FMLA papers filled out. Wanted to know if Edd Fabian would be able to fill them out for him.

## 2020-10-15 NOTE — Telephone Encounter (Signed)
I have not received any paperwork for this patient yet.

## 2020-10-15 NOTE — Telephone Encounter (Signed)
Called patient, he states that he needs FMLA paperwork filled out for the previous episode- patient had heart cath, was discharged. Primary cardiologist will be Dr.Harding- he seen him in the hospital, patient is going to be seeing Edd Fabian, NP on 05/05- but his FMLA forms were due on 4/23, I did advise that both MD and NP were out this week and were unable to complete this. He states Dr.Weaver's office was going to fax the FMLA forms over- I advised I would send a message over to [facility] who helps with FMLA forms we will see if we received it, and will have them look over the paperwork.   Patient verbalized understanding.

## 2020-10-19 ENCOUNTER — Telehealth: Payer: Self-pay | Admitting: Licensed Clinical Social Worker

## 2020-10-19 NOTE — Telephone Encounter (Signed)
Contacted by Alex Seta, RN, w/ AHF clinic. Pt was seen there at [facility] clinic following hospitalization. Pt has been out of work and will need f/u on whether or not he can return to work when he sees [place], NP, on 5/5. I passed this on to [place], LPN, to f/u on with Verdon Cummins, NP, during visit. Paperwork for FMLA was sent to AHF clinic, Alex Allen has sent that over to this clinic and I have let Katelyn know that it has been sent over. I f/u with Jefm Miles, she shares pt has had his insurance reinstated and is aware of financial counseling resources. No additional needs from our team at this time.   Alex Allen, MSW, LCSW [facility] Heart/Vascular Care Navigation  [phone]

## 2020-10-20 ENCOUNTER — Telehealth: Payer: Self-pay | Admitting: General Practice

## 2020-10-20 NOTE — Telephone Encounter (Signed)
[facility] received forms from [facility] for be completed, forms were given to provider 4/27.

## 2020-10-21 ENCOUNTER — Telehealth (HOSPITAL_COMMUNITY): Payer: Self-pay

## 2020-10-21 ENCOUNTER — Other Ambulatory Visit (HOSPITAL_COMMUNITY): Payer: Self-pay

## 2020-10-21 NOTE — Telephone Encounter (Signed)
Patient called stating prior authorization for Sherryll Burger was required with new insurance plan.  The primary insurance billing information is as follows: RxBin: [id number] PCN: [id number] Member ID: [id number] Group ID: [id number]   PA submitted on CoverMyMeds Key [id number]  Status is pending. Will continue to follow.

## 2020-10-22 NOTE — Telephone Encounter (Signed)
Entresto prior authorization is approved from 10/21/2020 - 10/21/2021.  Patient has a copay card for [facility] to lower to $10 per fill.

## 2020-10-26 NOTE — Progress Notes (Signed)
Cardiology Clinic Note   Patient Name: Alex Allen Date of Encounter: 10/28/2020  Primary Care Provider:  Scot Allen, [place] Primary Cardiologist:  Alex Hew, MD  Patient Profile    Alex Allen 40 year old male presents the clinic today for follow-up evaluation of his hypertension and nonischemic cardiomyopathy.  Past Medical History    Past Medical History:  Diagnosis Date  . Hypertension    Past Surgical History:  Procedure Laterality Date  . RIGHT/LEFT HEART CATH AND CORONARY ANGIOGRAPHY N/A 09/21/2020   Procedure: RIGHT/LEFT HEART CATH AND CORONARY ANGIOGRAPHY;  Surgeon: Alex Sine, MD;  Location: [facility];  Service: Cardiovascular;  Laterality: N/A;    Allergies  No Known Allergies  History of Present Illness    Mr. Alex Allen is a PMH of hypertension, hyperlipidemia, acute respiratory failure with hypoxemia, acute systolic CHF, nonischemic cardiomyopathy, and nonobstructive coronary artery disease.  His PMH also includes COVID-19 infection, and LBBB.  He was first diagnosed with high blood pressure in 2010.  He continued with his antihypertensive medications but unfortunately stopped in 2018.  He previously reported his medication.  Due to weight loss.  He stopped following up with his PCP and was seen for hypertensive urgency in the emergency department.  He was seen by Dr. Shan Allen 10/12/2020.  During that time he reported that he was doing well.  He did note that he was having low energy.  He reported his breathing was doing well.  He did note that he felt tired and rundown through the day.  He was not sure if his fatigue was related to his medications or his sleep apnea.  He had been weighing himself daily and reported stable weights between 347-350 pounds.  His resting heart rate was in 70s.  He had not been getting much exercise and reported that he was afraid to increase his physical activity.  He denied dyspnea and chest pain.  He was referred  for sleep study and his furosemide was transitioned to as needed.  He presents the clinic today for follow-up evaluation states he feels well.  He feels like he is beginning to have more pep in his staff again.  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  His weight has been stable.  His blood pressure today is 100/72.  We reviewed his echocardiogram and he expressed understanding.   I will provide him with a return to work note, have him increase his physical activity as tolerated give him salty 6 diet sheet, continue his current medication regimen, and follow-up with Dr. Ellyn Allen in 3 to 4 months.  Today he denies chest pain, shortness of breath, lower extremity edema, fatigue, palpitations, melena, hematuria, hemoptysis, diaphoresis, weakness, presyncope, syncope, orthopnea, and PND.     Home Medications    Prior to Admission medications   Medication Sig Start Date End Date Taking? Authorizing Provider  albuterol (VENTOLIN HFA) 108 (90 Base) MCG/ACT inhaler Inhale 2 puffs into the lungs every 6 (six) hours as needed for wheezing or shortness of breath. 09/22/20   Pokhrel, Corrie Mckusick, MD  aspirin EC 81 MG EC tablet Take 1 tablet (81 mg total) by mouth daily. Swallow whole. 09/22/20   Pokhrel, Corrie Mckusick, MD  atorvastatin (LIPITOR) 40 MG tablet Take 1 tablet (40 mg total) by mouth daily. 09/22/20   Pokhrel, Corrie Mckusick, MD  blood glucose meter kit and supplies Dispense based on patient and insurance preference. Use up to four times daily as directed. (FOR ICD-10  E10.9, E11.9). 09/22/20   Pokhrel, Corrie Mckusick, MD  carvedilol (COREG) 6.25 MG tablet Take 1 tablet (6.25 mg total) by mouth 2 (two) times daily with a meal. 09/27/20   Alex Roan, MD  dapagliflozin propanediol (FARXIGA) 10 MG TABS tablet TAKE 1 TABLET (10 MG TOTAL) BY MOUTH DAILY. 10/12/20 10/12/21  Alex Roan, MD  furosemide (LASIX) 40 MG tablet Take 1 tablet (40 mg total) by mouth daily. 09/22/20   Pokhrel, Laxman, MD   sacubitril-valsartan (ENTRESTO) 49-51 MG Take 1 tablet by mouth 2 (two) times daily. 10/12/20   Alex Roan, MD  spironolactone (ALDACTONE) 25 MG tablet Take 1 tablet (25 mg total) by mouth daily. 09/22/20   Pokhrel, Corrie Mckusick, MD    Family History    Family History  Problem Relation Age of Onset  . Thyroid disease Mother   . Diabetes Father   . Hypertension Father   . Hyperlipidemia Father    He indicated that his mother is alive. He indicated that his father is alive. He indicated that all of his three sisters are alive. He indicated that both of his brothers are alive. He indicated that his maternal grandmother is alive. He indicated that his maternal grandfather is alive. He indicated that the status of his paternal grandmother is unknown. He indicated that his paternal grandfather is deceased.  Social History    Social History   Socioeconomic History  . Marital status: Single    Spouse name: Not on file  . Number of children: Not on file  . Years of education: 71  . Highest education level: Doctorate  Occupational History  . Occupation: Exceptional Nurse, learning disability: Alex Allen  Tobacco Use  . Smoking status: Former Smoker    Years: 15.00    Types: Cigars    Quit date: 09/11/2020    Years since quitting: 0.1  . Smokeless tobacco: Never Used  Vaping Use  . Vaping Use: Never used  Substance and Sexual Activity  . Alcohol use: Yes    Comment: Socially  . Drug use: No  . Sexual activity: Not Currently  Other Topics Concern  . Not on file  Social History Narrative  . Not on file   Social Determinants of Health   Financial Resource Strain: Medium Risk  . Difficulty of Paying Living Expenses: Somewhat hard  Food Insecurity: No Food Insecurity  . Worried About Charity fundraiser in the Last Year: Never true  . Ran Out of Food in the Last Year: Never true  Transportation Needs: No Transportation Needs  . Lack of  Transportation (Medical): No  . Lack of Transportation (Non-Medical): No  Physical Activity: Inactive  . Days of Exercise per Week: 0 days  . Minutes of Exercise per Session: 0 min  Stress: Not on file  Social Connections: Not on file  Intimate Partner Violence: Not on file     Review of Systems    General:  No chills, fever, night sweats or weight changes.  Cardiovascular:  No chest pain, dyspnea on exertion, edema, orthopnea, palpitations, paroxysmal nocturnal dyspnea. Dermatological: No rash, lesions/masses Respiratory: No cough, dyspnea Urologic: No hematuria, dysuria Abdominal:   No nausea, vomiting, diarrhea, bright red blood per rectum, melena, or hematemesis Neurologic:  No visual changes, wkns, changes in mental status. All other systems reviewed and are otherwise negative except as noted above.  Physical Exam    VS:  BP 100/72 (BP Location: Left Arm, Patient Position:  Sitting, Cuff Size: Large)   Pulse 88   Ht $R'5\' 10"'jt$  (1.778 m)   Wt (!) 351 lb 6.4 oz (159.4 kg)   SpO2 97%   BMI 50.42 kg/m  , BMI Body mass index is 50.42 kg/m. GEN: Well nourished, well developed, in no acute distress. HEENT: normal. Neck: Supple, no JVD, carotid bruits, or masses. Cardiac: RRR, no murmurs, rubs, or gallops. No clubbing, cyanosis, edema.  Radials/DP/PT 2+ and equal bilaterally.  Respiratory:  Respirations regular and unlabored, clear to auscultation bilaterally. GI: Soft, nontender, nondistended, BS + x 4. MS: no deformity or atrophy. Skin: warm and dry, no rash. Neuro:  Strength and sensation are intact. Psych: Normal affect.  Accessory Clinical Findings    Recent Labs: 09/19/2020: B Natriuretic Peptide 181.5 09/20/2020: ALT 31 09/21/2020: Magnesium 2.0 09/22/2020: Hemoglobin 15.5; Platelets 214 10/12/2020: BUN 13; Creatinine, Ser 1.01; Potassium 4.1; Sodium 137   Recent Lipid Panel    Component Value Date/Time   CHOL 160 09/20/2020 0701   TRIG 74 09/20/2020 0701   HDL 34  (L) 09/20/2020 0701   CHOLHDL 4.7 09/20/2020 0701   VLDL 15 09/20/2020 0701   LDLCALC 111 (H) 09/20/2020 0701    ECG personally reviewed by me today-none today.  Cardiac catheterization 09/21/2020  1st Diag lesion is 50% stenosed.  Mid LAD lesion is 20% stenosed.  RPDA lesion is 30% stenosed.  Mid RCA lesion is 20% stenosed.  Prox Cx lesion is 30% stenosed.  Prox LAD lesion is 10% stenosed.   Mild nonobstructive CAD in very large caliber coronary arteries with 50% diffuse narrowing in the first diagonal vessel, mild luminal irregularity of the LAD with 20% narrowing after the diagonal takeoff; mild 5% narrowing in the proximal circumflex and large dominant RCA with regularity and 30% focal mid PDA stenosis on an angled mid PDA segment.  Minimally elevated right heart pressures.  Fick cardiac output 7.1 L/min with cardiac index 2.6 L/min/m.  RECOMMENDATION: Medical therapy for CAD.  Guideline directed therapy for HFrEF with ultimate transition from ARB to Entresto, carvedilol, spironolactone, and possible SGLT2 inhibition.  Will initiate lipid-lowering therapy with target LDL less than 70.  Optimal blood pressure control with target blood pressure less than 130/80 and ideal blood pressure less than 120/80.  Weight loss is essential in this patient weighing approximately 360 pounds.    Echocardiogram 09/20/2020 IMPRESSIONS    1. Left ventricular ejection fraction, by estimation, is 30%. The left  ventricle has moderate to severely decreased function. The left ventricle  demonstrates regional wall motion abnormalities with basal to mid inferior  and inferoseptal akinesis,  anteroseptal severe hypokinesis. The left ventricular internal cavity size  was mildly dilated. There is moderate left ventricular hypertrophy. Left  ventricular diastolic parameters are indeterminate.  2. Right ventricular systolic function is normal. The right ventricular  size is normal. Tricuspid  regurgitation signal is inadequate for assessing  PA pressure.  3. Left atrial size was mildly dilated.  4. The mitral valve is normal in structure. No evidence of mitral valve  regurgitation. No evidence of mitral stenosis.  5. The aortic valve is tricuspid. Aortic valve regurgitation is not  visualized. No aortic stenosis is present.  6. The inferior vena cava is dilated in size with <50% respiratory  variability, suggesting right atrial pressure of 15 mmHg.  Assessment & Plan   1.  Chronic combined systolic and diastolic CHF/nonischemic cardiomyopathy- no increased DOE or activity intolerance today.  Echocardiogram 3/22 showed LVEF 30%, moderate LVH,  and no significant valvular abnormalities.  Cardiac catheterization 3/22 showed nonobstructive CAD.  Felt to be related to poorly controlled hypertension, OSA, and recent COVID infection.  Continues with significant fatigue through the day. Continue Entresto, spironolactone, Farxiga, carvedilol, furosemide as needed Heart healthy low-sodium diet-salty 6 given Increase physical activity as tolerated Continue daily weights  Hypertension-BP today 100/72.  Well-controlled at home. Continue spironolactone, carvedilol, Entresto Heart healthy low-sodium diet-salty 6 given Increase physical activity as tolerated Maintain blood pressure log  Morbid obesity-weight today 351.4.  Continues to increase physical activity and monitor diet. Heart healthy low-sodium diet-salty 6 given Increase physical activity as tolerated Continue weight loss  Disposition: Follow-up with Dr. Ellyn Allen in 3-4 months.  Jossie Ng. Meerab Maselli NP-C    10/28/2020, 10:52 AM [place] [address] Office [phone] Fax [phone]  Notice: This dictation was prepared with Dragon dictation along with smaller phrase technology. Any transcriptional errors that result from this process are unintentional and may not be corrected upon  review.  I spent 14 minutes examining this patient, reviewing medications, and using patient centered shared decision making involving her cardiac care.  Prior to her visit I spent greater than 20 minutes reviewing her past medical history,  medications, and prior cardiac tests.

## 2020-10-28 ENCOUNTER — Other Ambulatory Visit: Payer: Self-pay

## 2020-10-28 ENCOUNTER — Encounter: Payer: Self-pay | Admitting: General Practice

## 2020-10-28 ENCOUNTER — Ambulatory Visit (INDEPENDENT_AMBULATORY_CARE_PROVIDER_SITE_OTHER): Payer: BC Managed Care – PPO | Admitting: General Practice

## 2020-10-28 VITALS — BP 100/72 | HR 88 | Ht 70.0 in | Wt 351.4 lb

## 2020-10-28 DIAGNOSIS — Z6841 Body Mass Index (BMI) 40.0 and over, adult: Secondary | ICD-10-CM

## 2020-10-28 DIAGNOSIS — I5042 Chronic combined systolic (congestive) and diastolic (congestive) heart failure: Secondary | ICD-10-CM

## 2020-10-28 DIAGNOSIS — I10 Essential (primary) hypertension: Secondary | ICD-10-CM

## 2020-10-28 NOTE — Patient Instructions (Signed)
Medication Instructions:  The current medical regimen is effective;  continue present plan and medications as directed. Please refer to the Current Medication list given to you today. *If you need a refill on your cardiac medications before your next appointment, please call your pharmacy*  Lab Work:   Testing/Procedures:  NONE    NONE  Special Instructions MAY RETURN TO WORK-SEE NOTE PROVIDED  PLEASE READ AND FOLLOW SALTY 6-ATTACHED-1,800mg  daily  PLEASE INCREASE PHYSICAL ACTIVITY AS TOLERATED  OK TO TRAVEL AND SING  Follow-Up: Your next appointment:  3-4 month(s) In Person with Bryan Lemma, MD ONLY At [facility], you and your health needs are our priority.  As part of our continuing mission to provide you with exceptional heart care, we have created designated Provider Care Teams.  These Care Teams include your primary Cardiologist (physician) and Advanced Practice Providers (APPs -  Physician Assistants and Nurse Practitioners) who all work together to provide you with the care you need, when you need it.  We recommend signing up for the patient portal called "MyChart".  Sign up information is provided on this After Visit Summary.  MyChart is used to connect with patients for Virtual Visits (Telemedicine).  Patients are able to view lab/test results, encounter notes, upcoming appointments, etc.  Non-urgent messages can be sent to your provider as well.   To learn more about what you can do with MyChart, go to [email or web address].              6 SALTY THINGS TO AVOID     1,800MG  DAILY

## 2020-11-24 DIAGNOSIS — N200 Calculus of kidney: Secondary | ICD-10-CM

## 2020-11-24 HISTORY — DX: Calculus of kidney: N20.0

## 2020-12-02 NOTE — Telephone Encounter (Signed)
COMPLETE

## 2020-12-16 ENCOUNTER — Ambulatory Visit
Admission: EM | Admit: 2020-12-16 | Discharge: 2020-12-16 | Disposition: A | Discharge status: Home / Self Care | Payer: BC Managed Care – PPO | Attending: Student | Admitting: Student

## 2020-12-16 ENCOUNTER — Other Ambulatory Visit: Payer: Self-pay

## 2020-12-16 DIAGNOSIS — I5042 Chronic combined systolic (congestive) and diastolic (congestive) heart failure: Secondary | ICD-10-CM | POA: Insufficient documentation

## 2020-12-16 DIAGNOSIS — Z8616 Personal history of COVID-19: Secondary | ICD-10-CM

## 2020-12-16 DIAGNOSIS — N200 Calculus of kidney: Secondary | ICD-10-CM | POA: Diagnosis not present

## 2020-12-16 LAB — POCT URINALYSIS DIP (MANUAL ENTRY)
Glucose, UA: 500 mg/dL — AB
Ketones, POC UA: NEGATIVE mg/dL
Leukocytes, UA: NEGATIVE
Nitrite, UA: POSITIVE — AB
Protein Ur, POC: 100 mg/dL — AB
Spec Grav, UA: 1.025 (ref 1.010–1.025)
Urobilinogen, UA: 1 E.U./dL
pH, UA: 6 (ref 5.0–8.0)

## 2020-12-16 MED ORDER — CEPHALEXIN 500 MG PO CAPS: 500.0000 mg | ORAL_CAPSULE | Freq: Four times a day (QID) | ORAL | 0 refills | Status: DC

## 2020-12-16 MED ORDER — TAMSULOSIN HCL 0.4 MG PO CAPS: 0.4000 mg | ORAL_CAPSULE | Freq: Every day | ORAL | 0 refills | Status: AC

## 2020-12-16 MED ORDER — TRAMADOL HCL 50 MG PO TABS: 50.0000 mg | ORAL_TABLET | Freq: Four times a day (QID) | ORAL | 0 refills | Status: DC | PRN

## 2020-12-16 NOTE — ED Provider Notes (Signed)
No radiation [facility]    CSN: [id number] Arrival date & time: 12/16/20  1548      History   Chief Complaint Chief Complaint  Patient presents with   Flank Pain    HPI Alex Allen is a 40 y.o. male presenting with flank pain and discolored urine.  Medical history nephrolithiasis, hypertension, CHF, cardiomyopathy, respiratory failure, hypertensive emergency, morbid obesity.  Endorses 1 day of left-sided flank pain.  Describes this as sharp and intermittent.  Denies radiation of pain. Hematuria, getting worse. Denies dysuria, frequency, urgency, n/v/d/abd pain, fevers/chills, abdnormal penile discharge, penile lesions. Denies new partners or STI risk.    HPI  Past Medical History:  Diagnosis Date   Hypertension     Patient Active Problem List   Diagnosis Date Noted   Severe hypertension 09/27/2020   Chronic combined systolic and diastolic congestive heart failure ([place]) 09/27/2020   Demand ischemia of myocardium ([place]) 09/21/2020   Nonischemic cardiomyopathy ([place]) 09/21/2020   Acute combined systolic and diastolic heart failure ([place]) 09/21/2020   Acute respiratory failure with hypoxia ([place]) 09/19/2020   Hypertensive emergency 09/19/2020   Hypokalemia 09/19/2020   Class 3 severe obesity due to excess calories with serious comorbidity and body mass index (BMI) of 45.0 to 49.9 in adult ([place]) 09/19/2020   Hyperglycemia 09/19/2020   SIRS (systemic inflammatory response syndrome) ([place]) 09/19/2020    Past Surgical History:  Procedure Laterality Date   RIGHT/LEFT HEART CATH AND CORONARY ANGIOGRAPHY N/A 09/21/2020   Procedure: RIGHT/LEFT HEART CATH AND CORONARY ANGIOGRAPHY;  Surgeon: Troy Sine, MD;  Location: [facility];  Service: Cardiovascular;  Laterality: N/A;       Home Medications    Prior to Admission medications   Medication Sig Start Date End Date Taking? Authorizing Provider  cephALEXin (KEFLEX) 500 MG capsule Take 1 capsule (500 mg  total) by mouth 4 (four) times daily. 12/16/20  Yes Hazel Sams, PA-C  tamsulosin (FLOMAX) 0.4 MG CAPS capsule Take 1 capsule (0.4 mg total) by mouth daily for 7 days. 12/16/20 12/23/20 Yes Hazel Sams, PA-C  traMADol (ULTRAM) 50 MG tablet Take 1 tablet (50 mg total) by mouth every 6 (six) hours as needed. 12/16/20  Yes Hazel Sams, PA-C  albuterol (VENTOLIN HFA) 108 (90 Base) MCG/ACT inhaler Inhale 2 puffs into the lungs every 6 (six) hours as needed for wheezing or shortness of breath. 09/22/20   Pokhrel, Corrie Mckusick, MD  aspirin EC 81 MG EC tablet Take 1 tablet (81 mg total) by mouth daily. Swallow whole. 09/22/20   Pokhrel, Corrie Mckusick, MD  atorvastatin (LIPITOR) 40 MG tablet Take 1 tablet (40 mg total) by mouth daily. 09/22/20   Pokhrel, Corrie Mckusick, MD  blood glucose meter kit and supplies Dispense based on patient and insurance preference. Use up to four times daily as directed. (FOR ICD-10 E10.9, E11.9). Patient not taking: Reported on 10/28/2020 09/22/20   Flora Lipps, MD  carvedilol (COREG) 6.25 MG tablet Take 1 tablet (6.25 mg total) by mouth 2 (two) times daily with a meal. 09/27/20   Katherine Roan, MD  dapagliflozin propanediol (FARXIGA) 10 MG TABS tablet TAKE 1 TABLET (10 MG TOTAL) BY MOUTH DAILY. 10/12/20 10/12/21  Katherine Roan, MD  furosemide (LASIX) 40 MG tablet Take 1 tablet (40 mg total) by mouth daily. 09/22/20   Pokhrel, [place]man, MD  sacubitril-valsartan (ENTRESTO) 49-51 MG Take 1 tablet by mouth 2 (two) times daily. 10/12/20   Katherine Roan, MD  spironolactone (  ALDACTONE) 25 MG tablet Take 1 tablet (25 mg total) by mouth daily. 09/22/20   Pokhrel, Corrie Mckusick, MD    Family History Family History  Problem Relation Age of Onset   Thyroid disease Mother    Diabetes Father    Hypertension Father    Hyperlipidemia Father     Social History Social History   Tobacco Use   Smoking status: Former    Pack years: 0.00    Types: Cigars    Quit date: 09/11/2020    Years since  quitting: 0.2   Smokeless tobacco: Never  Vaping Use   Vaping Use: Never used  Substance Use Topics   Alcohol use: Yes    Comment: Socially   Drug use: No     Allergies   Patient has no known allergies.   Review of Systems Review of Systems  Constitutional:  Negative for appetite change, chills, diaphoresis and fever.  Respiratory:  Negative for shortness of breath.   Cardiovascular:  Negative for chest pain.  Gastrointestinal:  Negative for abdominal pain, blood in stool, constipation, diarrhea, nausea and vomiting.  Genitourinary:  Positive for flank pain and hematuria. Negative for decreased urine volume, difficulty urinating, dysuria, frequency, genital sores, penile discharge, penile pain, penile swelling, scrotal swelling, testicular pain and urgency.  Musculoskeletal:  Negative for back pain.  Neurological:  Negative for dizziness, weakness and light-headedness.  All other systems reviewed and are negative.   Physical Exam Triage Vital Signs ED Triage Vitals  Enc Vitals Group     BP 12/16/20 1659 (!) 141/98     Pulse Rate 12/16/20 1659 85     Resp 12/16/20 1659 18     Temp 12/16/20 1659 98.3 F (36.8 C)     Temp Source 12/16/20 1659 Oral     SpO2 12/16/20 1659 95 %     Weight --      Height --      Head Circumference --      Peak Flow --      Pain Score 12/16/20 1705 0     Pain Loc --      Pain Edu? --      Excl. in [place]? --    No data found.  Updated Vital Signs BP (!) 141/98 (BP Location: Left Arm)   Pulse 85   Temp 98.3 F (36.8 C) (Oral)   Resp 18   SpO2 95%   Visual Acuity Right Eye Distance:   Left Eye Distance:   Bilateral Distance:    Right Eye Near:   Left Eye Near:    Bilateral Near:     Physical Exam Vitals reviewed.  Constitutional:      General: He is not in acute distress.    Appearance: Normal appearance. He is not ill-appearing.  HENT:     Head: Normocephalic and atraumatic.     Mouth/Throat:     Mouth: Mucous membranes  are moist.     Comments: Moist mucous membranes Eyes:     Extraocular Movements: Extraocular movements intact.     Pupils: Pupils are equal, round, and reactive to light.  Cardiovascular:     Rate and Rhythm: Normal rate and regular rhythm.     Heart sounds: Normal heart sounds.  Pulmonary:     Effort: Pulmonary effort is normal.     Breath sounds: Normal breath sounds. No wheezing, rhonchi or rales.  Abdominal:     General: Bowel sounds are normal. There is no distension.  Palpations: Abdomen is soft. There is no mass.     Tenderness: There is no abdominal tenderness. There is left CVA tenderness. There is no right CVA tenderness, guarding or rebound.  Skin:    General: Skin is warm.     Capillary Refill: Capillary refill takes less than 2 seconds.     Comments: Good skin turgor  Neurological:     General: No focal deficit present.     Mental Status: He is alert and oriented to person, place, and time.  Psychiatric:        Mood and Affect: Mood normal.        Behavior: Behavior normal.     UC Treatments / Results  Labs (all labs ordered are listed, but only abnormal results are displayed) Labs Reviewed  POCT URINALYSIS DIP (MANUAL ENTRY) - Abnormal; Notable for the following components:      Result Value   Color, UA red (*)    Glucose, UA =500 (*)    Bilirubin, UA small (*)    Blood, UA large (*)    Protein Ur, POC =100 (*)    Nitrite, UA Positive (*)    All other components within normal limits  URINE CULTURE    EKG   Radiology No results found.  Procedures Procedures (including critical care time)  Medications Ordered in UC Medications - No data to display  Initial Impression / Assessment and Plan / UC Course  I have reviewed the triage vital signs and the nursing notes.  Pertinent labs & imaging results that were available during my care of the patient were reviewed by me and considered in my medical decision making (see chart for details).      This patient is a very pleasant 40 y.o. year old male presenting with nephrolithiasis. L CVAT. Afebrile, nontachy, no abd pain.  UA today with large blood, positive nitrite, negative leuk.  Culture sent. Adamantly denies new partners or STI risk.  We will treat with Keflex, Flomax, tramadol as below.  For CHF related to covid19- continue current regimen.  Follow-up with PCP as scheduled in 1 day.  Strict ED return precautions discussed, patient verbalizes understanding agreement.  Final Clinical Impressions(s) / UC Diagnoses   Final diagnoses:  Nephrolithiasis  Chronic combined systolic and diastolic congestive heart failure ([place])  History of COVID-19     Discharge Instructions      -I suspect that you have a kidney stone.  Most kidney stones can pass on their own, but sometimes these are too big to pass on their own.  It is important that you drink plenty of fluids to help this pass.  I also sent a few medications to help: -Keflex is an antibiotic that you will take 4 times daily for 5 days. -Flomax (tamsulosin) is a pill that will help the kidney stone come out.  Take this once daily for as long as symptoms persist. -Tramadol for pain, up to every 6 hours.  This medication can cause drowsiness, so be careful and avoid taking for driving or operating machinery. -Look out for symptoms like abdominal pain, worsening back pain, new fever/chills.  These are signs that your kidney stone is too big to pass on its own.  If this occurs, head straight to the emergency department. -Follow-up with your PCP as scheduled tomorrow 6/24.       ED Prescriptions     Medication Sig Dispense Auth. Provider   cephALEXin (KEFLEX) 500 MG capsule Take 1 capsule (500  mg total) by mouth 4 (four) times daily. 20 capsule Hazel Sams, PA-C   traMADol (ULTRAM) 50 MG tablet Take 1 tablet (50 mg total) by mouth every 6 (six) hours as needed. 15 tablet Hazel Sams, PA-C   tamsulosin (FLOMAX) 0.4  MG CAPS capsule Take 1 capsule (0.4 mg total) by mouth daily for 7 days. 7 capsule Hazel Sams, PA-C      I have reviewed the PDMP during this encounter.   Hazel Sams, PA-C 12/16/20 1756

## 2020-12-16 NOTE — Discharge Instructions (Addendum)
-  I suspect that you have a kidney stone.  Most kidney stones can pass on their own, but sometimes these are too big to pass on their own.  It is important that you drink plenty of fluids to help this pass.  I also sent a few medications to help: -Keflex is an antibiotic that you will take 4 times daily for 5 days. -Flomax (tamsulosin) is a pill that will help the kidney stone come out.  Take this once daily for as long as symptoms persist. -Tramadol for pain, up to every 6 hours.  This medication can cause drowsiness, so be careful and avoid taking for driving or operating machinery. -Look out for symptoms like abdominal pain, worsening back pain, new fever/chills.  These are signs that your kidney stone is too big to pass on its own.  If this occurs, head straight to the emergency department. -Follow-up with your PCP as scheduled tomorrow 6/24.

## 2020-12-16 NOTE — ED Triage Notes (Signed)
Pt c/o lt flank discomfort with discolored urine. Denies urinary sx's.

## 2020-12-18 LAB — URINE CULTURE: Culture: NO GROWTH

## 2020-12-20 ENCOUNTER — Telehealth: Payer: Self-pay | Admitting: Cardiology

## 2020-12-20 MED ORDER — SACUBITRIL-VALSARTAN 49-51 MG PO TABS: 1.0000 | ORAL_TABLET | Freq: Two times a day (BID) | ORAL | 2 refills | Status: DC

## 2020-12-20 MED ORDER — FUROSEMIDE 40 MG PO TABS: 40.0000 mg | ORAL_TABLET | Freq: Every day | ORAL | 2 refills | Status: DC

## 2020-12-20 MED ORDER — DAPAGLIFLOZIN PROPANEDIOL 10 MG PO TABS: ORAL_TABLET | Freq: Every day | ORAL | 2 refills | Status: DC

## 2020-12-20 MED ORDER — CARVEDILOL 6.25 MG PO TABS
6.2500 mg | ORAL_TABLET | Freq: Two times a day (BID) | ORAL | 3 refills | Status: DC
Start: 2020-12-20 — End: 2021-11-14

## 2020-12-20 MED ORDER — ATORVASTATIN CALCIUM 40 MG PO TABS: 40.0000 mg | ORAL_TABLET | Freq: Every day | ORAL | 2 refills | Status: DC

## 2020-12-20 MED ORDER — SPIRONOLACTONE 25 MG PO TABS: 25.0000 mg | ORAL_TABLET | Freq: Every day | ORAL | 2 refills | Status: DC

## 2020-12-20 NOTE — Telephone Encounter (Signed)
Called patient to confirm that he is taking Entresto 49-51 mg BID, Lasix 40 mg daily, Lipitor 40 mg daily, Farxiga 10 mg daily, Coreg 6.25 mg BID, and Spironolactone 25 mg daily. Alex Allen confirmed that he is taking the mentioned medications as prescribed and confirmed that the Rx's need to go [facility]. He thanked me for calling. Patient was informed that the pharmacy will contact him when the refills are ready for pick up.

## 2020-12-20 NOTE — Telephone Encounter (Signed)
*  STAT* If patient is at the pharmacy, call can be transferred to refill team.   1. Which medications need to be refilled? (please list name of each medication and dose if known)  sacubitril-valsartan (ENTRESTO) 49-51 MG furosemide (LASIX) 40 MG tablet atorvastatin (LIPITOR) 40 MG tablet dapagliflozin propanediol (FARXIGA) 10 MG TABS tablet carvedilol (COREG) 6.25 MG tablet spironolactone (ALDACTONE) 25 MG tablet  2. Which pharmacy/location (including street and city if local pharmacy) is medication to be sent to? [facility] - [place], [place] - [address]  3. Do they need a 30 day or 90 day supply? 30 day  Patient is out of medication.

## 2021-02-03 ENCOUNTER — Ambulatory Visit: Payer: BC Managed Care – PPO | Admitting: Cardiology

## 2021-02-06 NOTE — Progress Notes (Signed)
Cardiology Clinic Note   Patient Name: ESAI STECKLEIN Date of Encounter: 02/06/2021  Primary Care Provider:  [facility] [place] Primary Cardiologist:  Glenetta Hew, MD  Patient Profile    CHANSE KAGEL 40 year old male presents the clinic today for follow-up evaluation of his hypertension and nonischemic cardiomyopathy.  Past Medical History    Past Medical History:  Diagnosis Date   Hypertension    Past Surgical History:  Procedure Laterality Date   RIGHT/LEFT HEART CATH AND CORONARY ANGIOGRAPHY N/A 09/21/2020   Procedure: RIGHT/LEFT HEART CATH AND CORONARY ANGIOGRAPHY;  Surgeon: Troy Sine, MD;  Location: [facility];  Service: Cardiovascular;  Laterality: N/A;    Allergies  No Known Allergies  History of Present Illness    Mr. Mcquaid is a PMH of hypertension, hyperlipidemia, acute respiratory failure with hypoxemia, acute systolic CHF, nonischemic cardiomyopathy, and nonobstructive coronary artery disease.  His PMH also includes COVID-19 infection, and LBBB.  He was first diagnosed with high blood pressure in 2010.  He continued with his antihypertensive medications but unfortunately stopped in 2018.  He previously reported his medication.  Due to weight loss.  He stopped following up with his PCP and was seen for hypertensive urgency in the emergency department.   He was seen by Dr. Shan Levans 10/12/2020.  During that time he reported that he was doing well.  He did note that he was having low energy.  He reported his breathing was doing well.  He did note that he felt tired and rundown through the day.  He was not sure if his fatigue was related to his medications or his sleep apnea.  He had been weighing himself daily and reported stable weights between 347-350 pounds.  His resting heart rate was in 70s.  He had not been getting much exercise and reported that he was afraid to increase his physical activity.  He denied dyspnea and chest pain.   He was referred for sleep study and his furosemide was transitioned to as needed.   He presented to the clinic 10/28/2020 for follow-up evaluation stated he felt well.  He felt like he was beginning to have more pep in his step again.  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  His weight had been stable.  His blood pressure was 100/72.  We reviewed his echocardiogram and he expressed understanding.   I provided him with a return to work note, asked him to increase his physical activity as tolerated give him salty 6 diet sheet, continue his current medication regimen, and planned follow-up with Dr. Ellyn Hack in 3 to 4 months.  Presented to the emergency department 12/16/2020 with complaints of flank pain and discolored urine.  He reported that his left-sided flank pain has been present for 1 day.  It was described as sharp and intermittent.  He was treated with Keflex, Flomax, and tramadol.  He was asked to follow-up with his PCP.  He presents to the clinic today for follow-up evaluation states he feels well.  He has been increasing his physical activity and has noticed only 1 episode of chest discomfort while he was walking.  He reports that he has not had any further episodes.  He is using several seasonings on his food that do not include salt.  His weight is down to 336 pounds from 351 pounds.  He reports that he now has a CPAP and is using it regularly.  He is waking up  well rested.  He reports that he occasionally has alerts on his blood pressure cuff that indicate he is having irregular heartbeats.  We reviewed his iPhone EKGs which showed normal sinus rhythm and sinus tachycardia.  He has returned to work full-time.  I will repeat his echocardiogram, have him continue his heart healthy low-sodium diet, maintain his physical activity and follow-up in 6 months.   Today he denies chest pain, shortness of breath, lower extremity edema, fatigue, palpitations, melena, hematuria, hemoptysis,  diaphoresis, weakness, presyncope, syncope, orthopnea, and PND.  Home Medications    Prior to Admission medications   Medication Sig Start Date End Date Taking? Authorizing Provider  albuterol (VENTOLIN HFA) 108 (90 Base) MCG/ACT inhaler Inhale 2 puffs into the lungs every 6 (six) hours as needed for wheezing or shortness of breath. 09/22/20   Pokhrel, Corrie Mckusick, MD  aspirin EC 81 MG EC tablet Take 1 tablet (81 mg total) by mouth daily. Swallow whole. 09/22/20   Pokhrel, Corrie Mckusick, MD  atorvastatin (LIPITOR) 40 MG tablet Take 1 tablet (40 mg total) by mouth daily. 12/20/20   Leonie Man, MD  blood glucose meter kit and supplies Dispense based on patient and insurance preference. Use up to four times daily as directed. (FOR ICD-10 E10.9, E11.9). Patient not taking: Reported on 10/28/2020 09/22/20   Flora Lipps, MD  carvedilol (COREG) 6.25 MG tablet Take 1 tablet (6.25 mg total) by mouth 2 (two) times daily with a meal. 12/20/20   Leonie Man, MD  cephALEXin (KEFLEX) 500 MG capsule Take 1 capsule (500 mg total) by mouth 4 (four) times daily. 12/16/20   Hazel Sams, PA-C  dapagliflozin propanediol (FARXIGA) 10 MG TABS tablet TAKE 1 TABLET (10 MG TOTAL) BY MOUTH DAILY. 12/20/20 12/20/21  Leonie Man, MD  furosemide (LASIX) 40 MG tablet Take 1 tablet (40 mg total) by mouth daily. 12/20/20   Leonie Man, MD  sacubitril-valsartan (ENTRESTO) 49-51 MG Take 1 tablet by mouth 2 (two) times daily. 12/20/20   Leonie Man, MD  spironolactone (ALDACTONE) 25 MG tablet Take 1 tablet (25 mg total) by mouth daily. 12/20/20   Leonie Man, MD  traMADol (ULTRAM) 50 MG tablet Take 1 tablet (50 mg total) by mouth every 6 (six) hours as needed. 12/16/20   Hazel Sams, PA-C    Family History    Family History  Problem Relation Age of Onset   Thyroid disease Mother    Diabetes Father    Hypertension Father    Hyperlipidemia Father    He indicated that his mother is alive. He indicated that  his father is alive. He indicated that all of his three sisters are alive. He indicated that both of his brothers are alive. He indicated that his maternal grandmother is alive. He indicated that his maternal grandfather is alive. He indicated that the status of his paternal grandmother is unknown. He indicated that his paternal grandfather is deceased.  Social History    Social History   Socioeconomic History   Marital status: Single    Spouse name: Not on file   Number of children: Not on file   Years of education: 18   Highest education level: Doctorate  Occupational History   Occupation: Exceptional Nurse, learning disability: [place]  Tobacco Use   Smoking status: Former    Types: Cigars    Quit date: 09/11/2020    Years since quitting: 0.4   Smokeless tobacco:  Never  Vaping Use   Vaping Use: Never used  Substance and Sexual Activity   Alcohol use: Yes    Comment: Socially   Drug use: No   Sexual activity: Not Currently  Other Topics Concern   Not on file  Social History Narrative   Not on file   Social Determinants of Health   Financial Resource Strain: Medium Risk   Difficulty of Paying Living Expenses: Somewhat hard  Food Insecurity: No Food Insecurity   Worried About Running Out of Food in the Last Year: Never true   Ran Out of Food in the Last Year: Never true  Transportation Needs: No Transportation Needs   Lack of Transportation (Medical): No   Lack of Transportation (Non-Medical): No  Physical Activity: Inactive   Days of Exercise per Week: 0 days   Minutes of Exercise per Session: 0 min  Stress: Not on file  Social Connections: Not on file  Intimate Partner Violence: Not on file     Review of Systems    General:  No chills, fever, night sweats or weight changes.  Cardiovascular:  No chest pain, dyspnea on exertion, edema, orthopnea, palpitations, paroxysmal nocturnal dyspnea. Dermatological: No rash,  lesions/masses Respiratory: No cough, dyspnea Urologic: No hematuria, dysuria Abdominal:   No nausea, vomiting, diarrhea, bright red blood per rectum, melena, or hematemesis Neurologic:  No visual changes, wkns, changes in mental status. All other systems reviewed and are otherwise negative except as noted above.  Physical Exam    VS:  There were no vitals taken for this visit. , BMI There is no height or weight on file to calculate BMI. GEN: Well nourished, well developed, in no acute distress. HEENT: normal. Neck: Supple, no JVD, carotid bruits, or masses. Cardiac: RRR, no murmurs, rubs, or gallops. No clubbing, cyanosis, edema.  Radials/DP/PT 2+ and equal bilaterally.  Respiratory:  Respirations regular and unlabored, clear to auscultation bilaterally. GI: Soft, nontender, nondistended, BS + x 4. MS: no deformity or atrophy. Skin: warm and dry, no rash. Neuro:  Strength and sensation are intact. Psych: Normal affect.  Accessory Clinical Findings    Recent Labs: 09/19/2020: B Natriuretic Peptide 181.5 09/20/2020: ALT 31 09/21/2020: Magnesium 2.0 09/22/2020: Hemoglobin 15.5; Platelets 214 10/12/2020: BUN 13; Creatinine, Ser 1.01; Potassium 4.1; Sodium 137   Recent Lipid Panel    Component Value Date/Time   CHOL 160 09/20/2020 0701   TRIG 74 09/20/2020 0701   HDL 34 (L) 09/20/2020 0701   CHOLHDL 4.7 09/20/2020 0701   VLDL 15 09/20/2020 0701   LDLCALC 111 (H) 09/20/2020 0701    ECG personally reviewed by me today-none today.  Cardiac catheterization 09/21/2020 1st Diag lesion is 50% stenosed. Mid LAD lesion is 20% stenosed. RPDA lesion is 30% stenosed. Mid RCA lesion is 20% stenosed. Prox Cx lesion is 30% stenosed. Prox LAD lesion is 10% stenosed.   Mild nonobstructive CAD in very large caliber coronary arteries with 50% diffuse narrowing in the first diagonal vessel, mild luminal irregularity of the LAD with 20% narrowing after the diagonal takeoff; mild 5% narrowing in  the proximal circumflex and large dominant RCA with regularity and 30% focal mid PDA stenosis on an angled mid PDA segment.   Minimally elevated right heart pressures.   Fick cardiac output 7.1 L/min with cardiac index 2.6 L/min/m.   RECOMMENDATION: Medical therapy for CAD.  Guideline directed therapy for HFrEF with ultimate transition from ARB to Entresto, carvedilol, spironolactone, and possible SGLT2 inhibition.  Will initiate lipid-lowering therapy  with target LDL less than 70.  Optimal blood pressure control with target blood pressure less than 130/80 and ideal blood pressure less than 120/80.  Weight loss is essential in this patient weighing approximately 360 pounds.      Echocardiogram 09/20/2020 IMPRESSIONS     1. Left ventricular ejection fraction, by estimation, is 30%. The left  ventricle has moderate to severely decreased function. The left ventricle  demonstrates regional wall motion abnormalities with basal to mid inferior  and inferoseptal akinesis,  anteroseptal severe hypokinesis. The left ventricular internal cavity size  was mildly dilated. There is moderate left ventricular hypertrophy. Left  ventricular diastolic parameters are indeterminate.   2. Right ventricular systolic function is normal. The right ventricular  size is normal. Tricuspid regurgitation signal is inadequate for assessing  PA pressure.   3. Left atrial size was mildly dilated.   4. The mitral valve is normal in structure. No evidence of mitral valve  regurgitation. No evidence of mitral stenosis.   5. The aortic valve is tricuspid. Aortic valve regurgitation is not  visualized. No aortic stenosis is present.   6. The inferior vena cava is dilated in size with <50% respiratory  variability, suggesting right atrial pressure of 15 mmHg.  Assessment & Plan   1.  Chronic combined systolic and diastolic CHF/nonischemic cardiomyopathy-euvolemic.  Echocardiogram 3/22 showed LVEF 30%, moderate LVH, and  no significant valvular abnormalities.  Cardiac catheterization 3/22 showed nonobstructive CAD.  Felt to be related to poorly controlled hypertension, OSA, and recent COVID infection.  Previously with significant fatigue however fatigue is resolving. Continue Entresto, spironolactone, Farxiga, carvedilol, furosemide as needed Heart healthy low-sodium diet-salty 6 given Increase physical activity as tolerated Continue daily weights  Repeat Echo  Hypertension-BP today 120/70.  Well-controlled at home. Continue spironolactone, carvedilol, Entresto Heart healthy low-sodium diet-salty 6 given Increase physical activity as tolerated Continue to maintain blood pressure log   OSA-reports compliance with CPAP. Continue CPAP use  Morbid obesity-weight today down to 336 pounds  From 351 pounds during last visit continues to increase physical activity and monitor diet. Heart healthy low-sodium diet-salty 6 given Increase physical activity as tolerated Continue weight loss   Disposition: Follow-up with Dr. Ellyn Hack in 6 months    Jossie Ng. Revella Shelton NP-C    02/06/2021, 11:21 AM [address] Office [phone] Fax [phone]  Notice: This dictation was prepared with Dragon dictation along with smaller phrase technology. Any transcriptional errors that result from this process are unintentional and may not be corrected upon review.  I spent 13 minutes examining this patient, reviewing medications, and using patient centered shared decision making involving her cardiac care.  Prior to her visit I spent greater than 20 minutes reviewing her past medical history,  medications, and prior cardiac tests.

## 2021-02-07 ENCOUNTER — Other Ambulatory Visit: Payer: Self-pay

## 2021-02-07 ENCOUNTER — Ambulatory Visit (INDEPENDENT_AMBULATORY_CARE_PROVIDER_SITE_OTHER): Payer: BC Managed Care – PPO | Admitting: General Practice

## 2021-02-07 ENCOUNTER — Encounter: Payer: Self-pay | Admitting: General Practice

## 2021-02-07 VITALS — BP 120/70 | HR 82 | Resp 20 | Ht 70.0 in | Wt 336.4 lb

## 2021-02-07 DIAGNOSIS — I10 Essential (primary) hypertension: Secondary | ICD-10-CM | POA: Diagnosis not present

## 2021-02-07 DIAGNOSIS — I5042 Chronic combined systolic (congestive) and diastolic (congestive) heart failure: Secondary | ICD-10-CM | POA: Diagnosis not present

## 2021-02-07 DIAGNOSIS — Z6841 Body Mass Index (BMI) 40.0 and over, adult: Secondary | ICD-10-CM

## 2021-02-07 DIAGNOSIS — I428 Other cardiomyopathies: Secondary | ICD-10-CM

## 2021-02-07 NOTE — Patient Instructions (Signed)
Medication Instructions:  The current medical regimen is effective;  continue present plan and medications as directed. Please refer to the Current Medication list given to you today.  *If you need a refill on your cardiac medications before your next appointment, please call your pharmacy*  Lab Work:    NONE     Testing/Procedures:  Echocardiogram - Your physician has requested that you have an echocardiogram. Echocardiography is a painless test that uses sound waves to create images of your heart. It provides your doctor with information about the size and shape of your heart and how well your heart's chambers and valves are working. This procedure takes approximately one hour. There are no restrictions for this procedure. This will be performed at our [facility] location - [address].  Special Instructions PLEASE READ AND FOLLOW SALTY 6-ATTACHED-1,800mg  daily  PLEASE INCREASE PHYSICAL ACTIVITY AS TOLERATED,   Follow-Up: Your next appointment:  6 month(s) In Person with Bryan Lemma, MD ONLY  At [facility], you and your health needs are our priority.  As part of our continuing mission to provide you with exceptional heart care, we have created designated Provider Care Teams.  These Care Teams include your primary Cardiologist (physician) and Advanced Practice Providers (APPs -  Physician Assistants and Nurse Practitioners) who all work together to provide you with the care you need, when you need it.            6 SALTY THINGS TO AVOID     1,800MG  DAILY

## 2021-02-25 ENCOUNTER — Encounter (HOSPITAL_COMMUNITY): Payer: Self-pay

## 2021-02-25 ENCOUNTER — Other Ambulatory Visit (HOSPITAL_COMMUNITY): Payer: BC Managed Care – PPO

## 2021-02-25 NOTE — Progress Notes (Signed)
Verified appointment "no show" status with R. Cathey at 08:28.  

## 2021-03-01 ENCOUNTER — Encounter (HOSPITAL_COMMUNITY): Payer: Self-pay | Admitting: General Practice

## 2021-03-15 ENCOUNTER — Telehealth (HOSPITAL_COMMUNITY): Payer: Self-pay | Admitting: General Practice

## 2021-03-15 NOTE — Telephone Encounter (Signed)
Routed to primary nurse as FYI 

## 2021-03-15 NOTE — Telephone Encounter (Signed)
Just an FYI. We have made several attempts to contact this patient including sending a letter to schedule or reschedule their echocardiogram. We will be removing the patient from the echo WQ.  02/25/21 NO SHOWED-MAILED LETTER [facility]     Thank you  

## 2021-03-24 ENCOUNTER — Other Ambulatory Visit: Payer: Self-pay | Admitting: Cardiology

## 2021-06-25 ENCOUNTER — Other Ambulatory Visit: Payer: Self-pay | Admitting: Cardiology

## 2021-08-01 ENCOUNTER — Ambulatory Visit: Payer: BC Managed Care – PPO | Admitting: Cardiology

## 2021-08-02 ENCOUNTER — Ambulatory Visit (HOSPITAL_COMMUNITY)
Admission: EM | Admit: 2021-08-02 | Discharge: 2021-08-02 | Disposition: A | Discharge status: Home / Self Care | Payer: BC Managed Care – PPO | Attending: Student | Admitting: Student

## 2021-08-02 ENCOUNTER — Encounter (HOSPITAL_COMMUNITY): Payer: Self-pay | Admitting: Emergency Medicine

## 2021-08-02 ENCOUNTER — Other Ambulatory Visit: Payer: Self-pay

## 2021-08-02 DIAGNOSIS — N200 Calculus of kidney: Secondary | ICD-10-CM | POA: Diagnosis not present

## 2021-08-02 DIAGNOSIS — R310 Gross hematuria: Secondary | ICD-10-CM

## 2021-08-02 HISTORY — DX: Disorder of kidney and ureter, unspecified: N28.9

## 2021-08-02 MED ORDER — TAMSULOSIN HCL 0.4 MG PO CAPS: 0.4000 mg | ORAL_CAPSULE | Freq: Every day | ORAL | 0 refills | Status: AC

## 2021-08-02 MED ORDER — CEPHALEXIN 500 MG PO CAPS: 500.0000 mg | ORAL_CAPSULE | Freq: Four times a day (QID) | ORAL | 0 refills | Status: DC

## 2021-08-02 NOTE — ED Triage Notes (Signed)
Patient noticed blood in urine.  Reports he has had this before and it was related to kidney stone.  Noticed blood today, intermittent mild pain in left flank

## 2021-08-02 NOTE — ED Notes (Signed)
Urine labeled and placed in lab 

## 2021-08-02 NOTE — ED Provider Notes (Signed)
[name]    CSN: [id number] Arrival date & time: 08/02/21  1830      History   Chief Complaint Chief Complaint  Patient presents with   Hematuria    HPI Alex Allen is a 41 y.o. male presenting with gross hematuria x12 hours. Medical history nephrolithiasis.  Describes intermittent colicky left flank pain radiating to the left groin, but this is tolerable and there is minimal pain currently.  Denies any symptoms other than the pain and gross hematuria. Hasn't attempted interventions at home. Denies dysuria, frequency, urgency, n/v/d, fevers/chills, abdnormal penile discharge.   HPI  Past Medical History:  Diagnosis Date   Hypertension    Renal disorder     Patient Active Problem List   Diagnosis Date Noted   Severe hypertension 09/27/2020   Chronic combined systolic and diastolic congestive heart failure ([place]) 09/27/2020   Demand ischemia of myocardium ([place]) 09/21/2020   Nonischemic cardiomyopathy ([place]) 09/21/2020   Acute combined systolic and diastolic heart failure ([place]) 09/21/2020   Acute respiratory failure with hypoxia ([place]) 09/19/2020   Hypertensive emergency 09/19/2020   Hypokalemia 09/19/2020   Class 3 severe obesity due to excess calories with serious comorbidity and body mass index (BMI) of 45.0 to 49.9 in adult ([place]) 09/19/2020   Hyperglycemia 09/19/2020   SIRS (systemic inflammatory response syndrome) ([place]) 09/19/2020    Past Surgical History:  Procedure Laterality Date   RIGHT/LEFT HEART CATH AND CORONARY ANGIOGRAPHY N/A 09/21/2020   Procedure: RIGHT/LEFT HEART CATH AND CORONARY ANGIOGRAPHY;  Surgeon: Troy Sine, MD;  Location: [facility];  Service: Cardiovascular;  Laterality: N/A;       Home Medications    Prior to Admission medications   Medication Sig Start Date End Date Taking? Authorizing Provider  cephALEXin (KEFLEX) 500 MG capsule Take 1 capsule (500 mg total) by mouth 4 (four) times daily. 08/02/21  Yes Hazel Sams, PA-C  tamsulosin (FLOMAX) 0.4 MG CAPS capsule Take 1 capsule (0.4 mg total) by mouth daily for 7 days. 08/02/21 08/09/21 Yes Hazel Sams, PA-C  albuterol (VENTOLIN HFA) 108 (90 Base) MCG/ACT inhaler Inhale 2 puffs into the lungs every 6 (six) hours as needed for wheezing or shortness of breath. 09/22/20   Pokhrel, Corrie Mckusick, MD  aspirin EC 81 MG EC tablet Take 1 tablet (81 mg total) by mouth daily. Swallow whole. 09/22/20   Pokhrel, Corrie Mckusick, MD  atorvastatin (LIPITOR) 40 MG tablet Take 1 tablet (40 mg total) by mouth daily. 12/20/20   Leonie Man, MD  carvedilol (COREG) 6.25 MG tablet Take 1 tablet (6.25 mg total) by mouth 2 (two) times daily with a meal. 12/20/20   Leonie Man, MD  dapagliflozin propanediol (FARXIGA) 10 MG TABS tablet TAKE 1 TABLET (10 MG TOTAL) BY MOUTH DAILY. 12/20/20 12/20/21  Leonie Man, MD  furosemide (LASIX) 40 MG tablet Take 1 tablet (40 mg total) by mouth daily. 12/20/20   Leonie Man, MD  sacubitril-valsartan (ENTRESTO) 49-51 MG TAKE 1 TABLET BY MOUTH 2 TIMES DAILY. 06/28/21   Leonie Man, MD  spironolactone (ALDACTONE) 25 MG tablet Take 1 tablet (25 mg total) by mouth daily. 12/20/20   Leonie Man, MD    Family History Family History  Problem Relation Age of Onset   Thyroid disease Mother    Diabetes Father    Hypertension Father    Hyperlipidemia Father     Social History Social History   Tobacco Use   Smoking  status: Former    Types: Cigars    Quit date: 09/11/2020    Years since quitting: 0.8   Smokeless tobacco: Never  Vaping Use   Vaping Use: Never used  Substance Use Topics   Alcohol use: Yes    Comment: Socially   Drug use: No     Allergies   Patient has no known allergies.   Review of Systems Review of Systems  Constitutional:  Negative for appetite change, chills, diaphoresis and fever.  Respiratory:  Negative for shortness of breath.   Cardiovascular:  Negative for chest pain.  Gastrointestinal:  Positive  for abdominal pain. Negative for blood in stool, constipation, diarrhea, nausea and vomiting.  Genitourinary:  Positive for flank pain and hematuria. Negative for decreased urine volume, difficulty urinating, dysuria, frequency, genital sores, penile discharge, penile pain, penile swelling, scrotal swelling, testicular pain and urgency.  Musculoskeletal:  Negative for back pain.  Neurological:  Negative for dizziness, weakness and light-headedness.  All other systems reviewed and are negative.   Physical Exam Triage Vital Signs ED Triage Vitals  Enc Vitals Group     BP 08/02/21 1954 105/75     Pulse Rate 08/02/21 1954 85     Resp 08/02/21 1954 (!) 21     Temp 08/02/21 1954 98 F (36.7 C)     Temp Source 08/02/21 1954 Oral     SpO2 08/02/21 1954 96 %     Weight --      Height --      Head Circumference --      Peak Flow --      Pain Score 08/02/21 1950 0     Pain Loc --      Pain Edu? --      Excl. in [place]? --    No data found.  Updated Vital Signs BP 105/75 (BP Location: Left Arm) Comment (BP Location): large cuff   Pulse 85    Temp 98 F (36.7 C) (Oral)    Resp (!) 21    SpO2 96%   Visual Acuity Right Eye Distance:   Left Eye Distance:   Bilateral Distance:    Right Eye Near:   Left Eye Near:    Bilateral Near:     Physical Exam Vitals reviewed.  Constitutional:      General: He is not in acute distress.    Appearance: Normal appearance. He is obese. He is not ill-appearing.  HENT:     Head: Normocephalic and atraumatic.     Mouth/Throat:     Mouth: Mucous membranes are moist.     Comments: Moist mucous membranes Eyes:     Extraocular Movements: Extraocular movements intact.     Pupils: Pupils are equal, round, and reactive to light.  Cardiovascular:     Rate and Rhythm: Normal rate and regular rhythm.     Heart sounds: Normal heart sounds.  Pulmonary:     Effort: Pulmonary effort is normal.     Breath sounds: Normal breath sounds. No wheezing, rhonchi or  rales.  Abdominal:     General: Bowel sounds are normal. There is no distension.     Palpations: Abdomen is soft. There is no mass.     Tenderness: There is abdominal tenderness in the left lower quadrant. There is left CVA tenderness. There is no right CVA tenderness, guarding or rebound. Negative signs include Murphy's sign, Rovsing's sign and McBurney's sign.     Comments: Exam limited due to body habitus. Minimal L  CVAT and LLQ pain on exam. No mass, hernia, guarding, rebound. Comfortable throughout exam.  Skin:    General: Skin is warm.     Capillary Refill: Capillary refill takes less than 2 seconds.     Comments: Good skin turgor  Neurological:     General: No focal deficit present.     Mental Status: He is alert and oriented to person, place, and time.  Psychiatric:        Mood and Affect: Mood normal.        Behavior: Behavior normal.     UC Treatments / Results  Labs (all labs ordered are listed, but only abnormal results are displayed) Labs Reviewed  POCT URINALYSIS DIPSTICK, ED / UC    EKG   Radiology No results found.  Procedures Procedures (including critical care time)  Medications Ordered in UC Medications - No data to display  Initial Impression / Assessment and Plan / UC Course  I have reviewed the triage vital signs and the nursing notes.  Pertinent labs & imaging results that were available during my care of the patient were reviewed by me and considered in my medical decision making (see chart for details).     This patient is a very pleasant 41 y.o. year old male presenting with suspected nephrolithiasis. Afebrile, nontachycardic, minimal reproducible abd pain or CVAT.  UA - gross hematuria, machine was not able to read this due to dark color.  Given mild symptoms, will proceed with Flomax and Keflex to cover for infection.  Follow-up with PCP for recheck, or head to the emergency department if symptoms worsen.  As this is his second kidney  stone, also recommended urology follow-up.  Final Clinical Impressions(s) / UC Diagnoses   Final diagnoses:  Nephrolithiasis  Gross hematuria     Discharge Instructions          -I suspect that you have a kidney stone.  Most kidney stones can pass on their own, but sometimes these are too big to pass on their own.  It is important that you drink plenty of fluids to help this pass.  I also sent a few medications to help: -Keflex is an antibiotic that you will take 4 times daily for 5 days. -Flomax (tamsulosin) is a pill that will help the kidney stone come out.  Take this once daily for as long as symptoms persist. -Look out for symptoms like abdominal pain, worsening back pain, new fever/chills.  These are signs that your kidney stone is too big to pass on its own.  If this occurs, head straight to the emergency department. -Follow-up with PCP for recheck and referral to urology   ED Prescriptions     Medication Sig Dispense Auth. Provider   tamsulosin (FLOMAX) 0.4 MG CAPS capsule Take 1 capsule (0.4 mg total) by mouth daily for 7 days. 7 capsule Hazel Sams, PA-C   cephALEXin (KEFLEX) 500 MG capsule Take 1 capsule (500 mg total) by mouth 4 (four) times daily. 20 capsule Hazel Sams, PA-C      I have reviewed the PDMP during this encounter.   Hazel Sams, PA-C 08/02/21 2028

## 2021-08-02 NOTE — Discharge Instructions (Addendum)
°-  I suspect that you have a kidney stone.  Most kidney stones can pass on their own, but sometimes these are too big to pass on their own.  It is important that you drink plenty of fluids to help this pass.  I also sent a few medications to help: -Keflex is an antibiotic that you will take 4 times daily for 5 days. -Flomax (tamsulosin) is a pill that will help the kidney stone come out.  Take this once daily for as long as symptoms persist. -Look out for symptoms like abdominal pain, worsening back pain, new fever/chills.  These are signs that your kidney stone is too big to pass on its own.  If this occurs, head straight to the emergency department. -Follow-up with PCP for recheck and referral to urology

## 2021-09-19 ENCOUNTER — Other Ambulatory Visit: Payer: Self-pay | Admitting: Cardiology

## 2021-09-21 ENCOUNTER — Other Ambulatory Visit: Payer: Self-pay | Admitting: Cardiology

## 2021-09-24 HISTORY — PX: OTHER SURGICAL HISTORY: SHX169

## 2021-10-03 ENCOUNTER — Encounter: Payer: Self-pay | Admitting: Student

## 2021-10-03 DIAGNOSIS — I2510 Atherosclerotic heart disease of native coronary artery without angina pectoris: Secondary | ICD-10-CM | POA: Insufficient documentation

## 2021-10-03 DIAGNOSIS — I447 Left bundle-branch block, unspecified: Secondary | ICD-10-CM | POA: Insufficient documentation

## 2021-10-03 NOTE — Progress Notes (Signed)
?Cardiology Office Note:   ? ?Date:  10/05/2021  ? ?ID:  Alex Allen, DOB [birth year], MRN [id number] ? ?PCP:  [facility]  ?Cardiologist:  Glenetta Hew, MD  ?Electrophysiologist:  None  ? ?Referring MD: [facility]  ? ?Chief Complaint: follow-up of CHF ? ?History of Present Illness:   ? ?Alex Allen is a 41 y.o. male with a history of non-obstructive CAD on cardiac catheterization in 08/2020, non-ischemic cardiomyopathy/ chronic combined CHF with EF of 30% on Echo in 08/2020, LBBB, hypertension, obstructive sleep apnea on CPAP, nephrolithiasis, and morbid obesity who is followed by Dr. Ellyn Hack and presents today for routine follow-up.  ? ?Patient was admitted in in 08/2020 for hypertensive emergency and acute CHF. High-sensitivity troponin peaked at 989. Echo showed LVEF of 30% with basal to mid inferior and inferoseptal akinesis and anteroseptal severe hypokinesis as well as moderate LVH. He underwent right/left cardiac catheterization which showed mild non-obstructive CAD and minimally elevated right heart pressures. Cardiomyopathy was felt to be secondary to uncontrolled hypertension, obstructive sleep apnea, recent COVID infection, and possible LBBB. He was diuresed and started on GDMT. ? ?Patient was last seen by Coletta Memos, NP, in 01/2021 at which time he was doing well with no cardiac complaints. His weight was down from 351 lbs to 336 lbs. He was continued on home medication regimen and plan was for repeat Echo; however, this has not been done yet.  ? ?Since last visit, he was seen in the ED in 07/2021 for gross hematuria and also reported intermittent colicky left flank pain radiating to the left groin. He was felt to likely have a kidney stone which he has had in the past. He was started on Flomax and Keflex and advised to follow-up with PCP.  ? ?Patient presents today for follow-up. Here alone. Doing well from a cardiac standpoint. He reports some dyspnea on  exertion when he was carrying a heavy backpack up a flight of steps a couple of months ago but usually does not have any dyspnea on exertion. No orthopnea or PND. He reports some indigestion after eating spicy foods or citrus foods but no chest pain. No palpitaitons, lightheadedness, dizziness, syncope. He weighs 340 lbs today, up 4lbs from last visit. He states he has had a very stressful past couple months due to multiple family members being sick so he has not been exercising or eating as well. He reports compliance with his medications and seems to be tolerating them well.  ? ?Recent Labs from [facility] on 09/09/2021: ?- CMET: Glucose 116, Na 136, Cr 3.8, BUN  13, Cr 0.87. LFTs normal. ?- Lipid panel: Total Cholesterol 83, Triglycerides 176, HDL 28, LDL 26. ?- Hemoglobin A1c 6.4.  ? ?Past Medical History:  ?Diagnosis Date  ? CAD (coronary artery disease)   ? non-obstructive CAD on LHC in 08/2020  ? Chronic combined systolic and diastolic CHF (congestive heart failure) ([place])   ? LVEF of 30% on Echo in 08/2020  ? Hypertension   ? LBBB (left bundle branch block)   ? Morbid obesity ([place])   ? Nephrolithiasis   ? Non-ischemic cardiomyopathy ([place])   ? Obstructive sleep apnea   ? ? ?Past Surgical History:  ?Procedure Laterality Date  ? RIGHT/LEFT HEART CATH AND CORONARY ANGIOGRAPHY N/A 09/21/2020  ? Procedure: RIGHT/LEFT HEART CATH AND CORONARY ANGIOGRAPHY;  Surgeon: Troy Sine, MD;  Location: [facility];  Service: Cardiovascular;  Laterality: N/A;  ? ? ?  Current Medications: ?Current Meds  ?Medication Sig  ? albuterol (VENTOLIN HFA) 108 (90 Base) MCG/ACT inhaler Inhale 2 puffs into the lungs every 6 (six) hours as needed for wheezing or shortness of breath.  ? atorvastatin (LIPITOR) 40 MG tablet Take 1 tablet (40 mg total) by mouth daily.  ? carvedilol (COREG) 6.25 MG tablet Take 1 tablet (6.25 mg total) by mouth 2 (two) times daily with a meal.  ? cephALEXin (KEFLEX) 500 MG capsule Take 1 capsule (500 mg  total) by mouth 4 (four) times daily.  ? dapagliflozin propanediol (FARXIGA) 10 MG TABS tablet Take 1 tablet (10 mg total) by mouth daily. Patient need appointment for future refills.  ? furosemide (LASIX) 40 MG tablet Take 1 tablet (40 mg total) by mouth daily.  ? sacubitril-valsartan (ENTRESTO) 49-51 MG TAKE 1 TABLET BY MOUTH 2 TIMES DAILY.  ? spironolactone (ALDACTONE) 25 MG tablet Take 1 tablet (25 mg total) by mouth daily. Patient need a appointment for future refills.  ? [DISCONTINUED] aspirin (ASPIRIN ADULT LOW STRENGTH) 81 MG EC tablet Take 1 tablet (81 mg total) by mouth 5 (five) times daily. SWALLOW WHOLE. Patient need a appointment for future refills.  ?  ? ?Allergies:   Patient has no known allergies.  ? ?Social History  ? ?Socioeconomic History  ? Marital status: Single  ?  Spouse name: Not on file  ? Number of children: Not on file  ? Years of education: 78  ? Highest education level: Doctorate  ?Occupational History  ? Occupation: [employer] and exercise psychologist  ?  Employer: [employer]  ?Tobacco Use  ? Smoking status: Former  ?  Types: Cigars  ?  Quit date: 09/11/2020  ?  Years since quitting: 1.0  ? Smokeless tobacco: Never  ?Vaping Use  ? Vaping Use: Never used  ?Substance and Sexual Activity  ? Alcohol use: Yes  ?  Comment: Socially  ? Drug use: No  ? Sexual activity: Not Currently  ?Other Topics Concern  ? Not on file  ?Social History Narrative  ? Not on file  ? ?Social Determinants of Health  ? ?Financial Resource Strain: Not on file  ?Food Insecurity: Not on file  ?Transportation Needs: Not on file  ?Physical Activity: Not on file  ?Stress: Not on file  ?Social Connections: Not on file  ?  ? ?Family History: ?The patient's family history includes Diabetes in his father; Hyperlipidemia in his father; Hypertension in his father; Thyroid disease in his mother. ? ?ROS:   ?Please see the history of present illness.    ? ?EKGs/Labs/Other Studies Reviewed:   ? ?The  following studies were reviewed today: ? ?Echocardiogram 09/20/2020: ?Impressions: ? 1. Left ventricular ejection fraction, by estimation, is 30%. The left  ?ventricle has moderate to severely decreased function. The left ventricle  ?demonstrates regional wall motion abnormalities with basal to mid inferior  ?and inferoseptal akinesis,  ?anteroseptal severe hypokinesis. The left ventricular internal cavity size  ?was mildly dilated. There is moderate left ventricular hypertrophy. Left  ?ventricular diastolic parameters are indeterminate.  ? 2. Right ventricular systolic function is normal. The right ventricular  ?size is normal. Tricuspid regurgitation signal is inadequate for assessing  ?PA pressure.  ? 3. Left atrial size was mildly dilated.  ? 4. The mitral valve is normal in structure. No evidence of mitral valve  ?regurgitation. No evidence of mitral stenosis.  ? 5. The aortic valve is tricuspid. Aortic valve regurgitation is not  ?visualized. No aortic  stenosis is present.  ? 6. The inferior vena cava is dilated in size with <50% respiratory  ?variability, suggesting right atrial pressure of 15 mmHg.  ?_______________ ? ?Right/Left Cardiac Catheterization 09/21/2020: ?1st Diag lesion is 50% stenosed. ?Mid LAD lesion is 20% stenosed. ?RPDA lesion is 30% stenosed. ?Mid RCA lesion is 20% stenosed. ?Prox Cx lesion is 30% stenosed. ?Prox LAD lesion is 10% stenosed. ?  ?Mild nonobstructive CAD in very large caliber coronary arteries with 50% diffuse narrowing in the first diagonal vessel, mild luminal irregularity of the LAD with 20% narrowing after the diagonal takeoff; mild 5% narrowing in the proximal circumflex and large dominant RCA with regularity and 30% focal mid PDA stenosis on an angled mid PDA segment. ?  ?Minimally elevated right heart pressures. ?  ?Fick cardiac output 7.1 L/min with cardiac index 2.6 L/min/m?. ?  ?Recommendation: ?Medical therapy for CAD.  Guideline directed therapy for HFrEF with  ultimate transition from ARB to Entresto, carvedilol, spironolactone, and possible SGLT2 inhibition.  Will initiate lipid-lowering therapy with target LDL less than 70.  Optimal blood pressure control with target blo

## 2021-10-05 ENCOUNTER — Ambulatory Visit (INDEPENDENT_AMBULATORY_CARE_PROVIDER_SITE_OTHER): Payer: BC Managed Care – PPO

## 2021-10-05 ENCOUNTER — Ambulatory Visit: Payer: BC Managed Care – PPO | Admitting: Student

## 2021-10-05 ENCOUNTER — Encounter: Payer: Self-pay | Admitting: Student

## 2021-10-05 VITALS — BP 106/74 | HR 89 | Ht 70.0 in | Wt 340.0 lb

## 2021-10-05 DIAGNOSIS — I428 Other cardiomyopathies: Secondary | ICD-10-CM | POA: Diagnosis not present

## 2021-10-05 DIAGNOSIS — I2510 Atherosclerotic heart disease of native coronary artery without angina pectoris: Secondary | ICD-10-CM

## 2021-10-05 DIAGNOSIS — I493 Ventricular premature depolarization: Secondary | ICD-10-CM | POA: Diagnosis not present

## 2021-10-05 DIAGNOSIS — G4733 Obstructive sleep apnea (adult) (pediatric): Secondary | ICD-10-CM

## 2021-10-05 DIAGNOSIS — I5042 Chronic combined systolic (congestive) and diastolic (congestive) heart failure: Secondary | ICD-10-CM | POA: Diagnosis not present

## 2021-10-05 DIAGNOSIS — I447 Left bundle-branch block, unspecified: Secondary | ICD-10-CM | POA: Diagnosis not present

## 2021-10-05 DIAGNOSIS — I10 Essential (primary) hypertension: Secondary | ICD-10-CM

## 2021-10-05 MED ORDER — ASPIRIN 81 MG PO TBEC: 81.0000 mg | DELAYED_RELEASE_TABLET | Freq: Every day | ORAL | 3 refills | Status: DC

## 2021-10-05 NOTE — Progress Notes (Unsigned)
Enrolled for [employer] to mail a ZIO XT long term holter monitor to the patients address on file.   Dr. Harding to read. 

## 2021-10-05 NOTE — Patient Instructions (Signed)
Medication Instructions:  ?No Changes ?*If you need a refill on your cardiac medications before your next appointment, please call your pharmacy* ? ? ?Lab Work: ?None ?If you have labs (blood work) drawn today and your tests are completely normal, you will receive your results only by: ?MyChart Message (if you have MyChart) OR ?A paper copy in the mail ?If you have any lab test that is abnormal or we need to change your treatment, we will call you to review the results. ? ? ?Testing/Procedures: [address], [address] ?Your physician has requested that you have an echocardiogram. Echocardiography is a painless test that uses sound waves to create images of your heart. It provides your doctor with information about the size and shape of your heart and how well your heart?s chambers and valves are working. This procedure takes approximately one hour. There are no restrictions for this procedure.  ? ?ZIO AT Long term monitor-Live Telemetry ? ?Your physician has requested you wear a ZIO patch monitor for 3 days.  ?This is a single patch monitor. [employer] supplies one patch monitor per enrollment. Additional  ?stickers are not available.  ?Please do not apply patch if you will be having a Nuclear Stress Test, Echocardiogram, Cardiac CT, MRI,  ?or Chest Xray during the period you would be wearing the monitor. The patch cannot be worn during  ?these tests. You cannot remove and re-apply the ZIO AT patch monitor.  ?Your ZIO patch monitor will be mailed 3 day USPS to your address on file. It may take 3-5 days to  ?receive your monitor after you have been enrolled.  ?Once you have received your monitor, please review the enclosed instructions. Your monitor has  ?already been registered assigning a specific monitor serial # to you.  ? ?Billing and Patient Assistance Program information ? ?[employer] has been supplied with any insurance information on record for billing. ?[employer] offers a sliding scale Patient  Assistance Program for patients without insurance, or whose  ?insurance does not completely cover the cost of the ZIO patch monitor. You must apply for the  ?Patient Assistance Program to qualify for the discounted rate. To apply, call [employer] at [phone],  ?select option 4, select option 2 , ask to apply for the Patient Assistance Program, (you can request an  ?interpreter if needed). [employer] will ask your household income and how many people are in your  ?household. [employer] will quote your out-of-pocket cost based on this information. They will also be able  ?to set up a 12 month interest free payment plan if needed. ? ?Applying the monitor  ? ?Shave hair from upper left chest.  ?Hold the abrader disc by orange tab. Rub the abrader in 40 strokes over left upper chest as indicated in  ?your monitor instructions.  ?Clean area with 4 enclosed alcohol pads. Use all pads to ensure the area is cleaned thoroughly. Let  ?dry.  ?Apply patch as indicated in monitor instructions. Patch will be placed under collarbone on left side of  ?chest with arrow pointing upward.  ?Rub patch adhesive wings for 2 minutes. Remove the white label marked "1". Remove the white label  ?marked "2". Rub patch adhesive wings for 2 additional minutes.  ?While looking in a mirror, press and release button in center of patch. A small green light will flash 3-4  ?times. This will be your only indicator that the monitor has been turned on.  ?Do not shower for the first 24 hours. You  may shower after the first 24 hours.  ?Press the button if you feel a symptom. You will hear a small click. Record Date, Time and Symptom in  ?the Patient Log.  ? ?Starting the Gateway ? ?In your kit there is a small plastic box the size of a cellphone. This is Airline pilot. It transmits all your  ?recorded data to [employer]. This box must always stay within 10 feet of you. Open the box and push the *  ?button. There will be a light that blinks orange and then green a  few times. When the light stops  ?blinking, the Gateway is connected to the ZIO patch. ?Call [employer] at [phone] to confirm your monitor is transmitting. ? ?Returning your monitor ? ?Remove your patch and place it inside the Gateway. In the lower half of the Gateway there is a white  ?bag with prepaid postage on it. Place Gateway in bag and seal. Mail package back to [place] as soon as  ?possible. Your physician should have your final report approximately 7 days after you have mailed back  ?your monitor. ?Call [facility] at [phone] if you have questions regarding your ZIO AT  ?patch monitor. Call them immediately if you see an orange light blinking on your monitor.  ?If your monitor falls off in less than 4 days, contact our Monitor department at [phone]. If your  ?monitor becomes loose or falls off after 4 days call [employer] at [phone] for suggestions on  ?securing your monitor  ?Follow-Up: ?At [facility], you and your health needs are our priority.  As part of our continuing mission to provide you with exceptional heart care, we have created designated Provider Care Teams.  These Care Teams include your primary Cardiologist (physician) and Advanced Practice Providers (APPs -  Physician Assistants and Nurse Practitioners) who all work together to provide you with the care you need, when you need it. ? ?We recommend signing up for the patient portal called "MyChart".  Sign up information is provided on this After Visit Summary.  MyChart is used to connect with patients for Virtual Visits (Telemedicine).  Patients are able to view lab/test results, encounter notes, upcoming appointments, etc.  Non-urgent messages can be sent to your provider as well.   ?To learn more about what you can do with MyChart, go to [email or web address].   ? ?Your next appointment:   ?6 month(s) ? ?The format for your next appointment:   ?In Person ? ?Provider:   ?Glenetta Hew, MD    ? ? ? ? ?Important Information About Sugar ? ? ? ? ?  ?

## 2021-10-10 DIAGNOSIS — I493 Ventricular premature depolarization: Secondary | ICD-10-CM

## 2021-10-10 DIAGNOSIS — I447 Left bundle-branch block, unspecified: Secondary | ICD-10-CM

## 2021-10-18 ENCOUNTER — Other Ambulatory Visit: Payer: Self-pay | Admitting: Cardiology

## 2021-10-19 ENCOUNTER — Other Ambulatory Visit: Payer: Self-pay | Admitting: Cardiology

## 2021-10-20 ENCOUNTER — Ambulatory Visit (HOSPITAL_COMMUNITY): Payer: BC Managed Care – PPO | Attending: Cardiology

## 2021-10-20 ENCOUNTER — Encounter (HOSPITAL_COMMUNITY): Payer: Self-pay | Admitting: Student

## 2021-11-10 ENCOUNTER — Ambulatory Visit (HOSPITAL_COMMUNITY): Payer: BC Managed Care – PPO | Attending: Cardiovascular Disease

## 2021-11-10 DIAGNOSIS — I10 Essential (primary) hypertension: Secondary | ICD-10-CM | POA: Insufficient documentation

## 2021-11-10 DIAGNOSIS — I5042 Chronic combined systolic (congestive) and diastolic (congestive) heart failure: Secondary | ICD-10-CM | POA: Insufficient documentation

## 2021-11-10 DIAGNOSIS — I428 Other cardiomyopathies: Secondary | ICD-10-CM | POA: Diagnosis not present

## 2021-11-10 DIAGNOSIS — I2510 Atherosclerotic heart disease of native coronary artery without angina pectoris: Secondary | ICD-10-CM | POA: Diagnosis present

## 2021-11-10 DIAGNOSIS — G4733 Obstructive sleep apnea (adult) (pediatric): Secondary | ICD-10-CM | POA: Insufficient documentation

## 2021-11-10 DIAGNOSIS — I447 Left bundle-branch block, unspecified: Secondary | ICD-10-CM | POA: Diagnosis present

## 2021-11-10 HISTORY — PX: TRANSTHORACIC ECHOCARDIOGRAM: SHX275

## 2021-11-10 LAB — ECHOCARDIOGRAM COMPLETE: S' Lateral: 5.1 cm

## 2021-11-10 MED ORDER — PERFLUTREN LIPID MICROSPHERE
1.0000 mL | INTRAVENOUS | Status: AC | PRN
  Administered 2021-11-10: 3 mL via INTRAVENOUS

## 2021-11-14 ENCOUNTER — Encounter: Payer: Self-pay | Admitting: Cardiology

## 2021-11-14 ENCOUNTER — Ambulatory Visit: Payer: BC Managed Care – PPO | Admitting: Cardiology

## 2021-11-14 VITALS — BP 122/70 | HR 84 | Ht 69.0 in | Wt 346.8 lb

## 2021-11-14 DIAGNOSIS — I493 Ventricular premature depolarization: Secondary | ICD-10-CM | POA: Diagnosis not present

## 2021-11-14 DIAGNOSIS — I2510 Atherosclerotic heart disease of native coronary artery without angina pectoris: Secondary | ICD-10-CM | POA: Diagnosis not present

## 2021-11-14 DIAGNOSIS — I5042 Chronic combined systolic (congestive) and diastolic (congestive) heart failure: Secondary | ICD-10-CM | POA: Diagnosis not present

## 2021-11-14 DIAGNOSIS — I428 Other cardiomyopathies: Secondary | ICD-10-CM | POA: Diagnosis not present

## 2021-11-14 MED ORDER — CARVEDILOL 6.25 MG PO TABS: 9.3750 mg | ORAL_TABLET | Freq: Two times a day (BID) | ORAL | 3 refills | Status: DC

## 2021-11-14 NOTE — Progress Notes (Signed)
Primary Care Provider: [facility] [place] Cardiologist: Bryan Lemma, MD Electrophysiologist: None  Clinic Note: Chief Complaint  Patient presents with   Follow-up    Echo results   Cardiomyopathy    EF down on echo, but no change in symptoms.  Stable.   Hypertension    BP well controlled    ===================================  ASSESSMENT/PLAN   Problem List Items Addressed This Visit       Cardiology Problems   Non-ischemic cardiomyopathy (HCC) (Chronic)    Minimal CAD with significant reduction in EF from initial echo despite being on guideline directed medical therapy.  He is a little lighter than he was last year, but has not successfully lost much weight.  Plan from the beginning was to consider cardiac MRI and now that has been well over a year with no improved EF and underlying lipomatous block will need referral to EP to consider CRT-D Really minimal symptoms.  Unfortunately EF has dropped despite guideline directed medical therapy.  This would suggest underlying etiology.  With regional wall motion normality's in the absence of CAD, need to more closely evaluate cardiomyopathy.  Persistent LBBB with moderate PVC burden could well be playing a role.    Plan EP referral for possible CRT-D and potential antiarrhythmic for PVCs.  Titrate up beta-blocker slowly      Relevant Medications   carvedilol (COREG) 6.25 MG tablet   Other Relevant Orders   EKG 12-Lead (Completed)   MR CARDIAC MORPHOLOGY W WO CONTRAST   Ambulatory referral to Cardiac Electrophysiology   Basic metabolic panel   CBC   Chronic combined systolic and diastolic CHF (congestive heart failure) (HCC) - Primary (Chronic)    Based on his symptoms and the fact that he is relatively euvolemic exam, really NYHA Class IIa if not Class Isymptoms.  Plan: Continue current meds as is but increase carvedilol to 1.5 tab (9.375 mg) twice daily -> would like to try to go elevated 12.5  twice daily as long as he can tolerate. Entresto 49 to 51 mg twice daily-blood pressure probably will tolerate titration further at this point. Spironolactone 25 mg daily Farxiga 10 mg daily Continue Lasix with sliding scale-stressed importance of daily weights Continue to encourage getting back onto the diet control regimen with weight loss.  Needs to be more active.  Hopefully over the summer this can start a new trend Check cardiac MRI. EP referral for possible CRT-D        Relevant Medications   carvedilol (COREG) 6.25 MG tablet   Other Relevant Orders   EKG 12-Lead (Completed)   MR CARDIAC MORPHOLOGY W WO CONTRAST   Ambulatory referral to Cardiac Electrophysiology   Basic metabolic panel   CBC   Frequent unifocal PVCs (Chronic)   Relevant Medications   carvedilol (COREG) 6.25 MG tablet   Other Relevant Orders   EKG 12-Lead (Completed)   MR CARDIAC MORPHOLOGY W WO CONTRAST   Ambulatory referral to Cardiac Electrophysiology   Basic metabolic panel   CBC   Coronary artery disease, non-occlusive (Chronic)    Minimal CAD by cath.  On aspirin and statin as well as beta-blocker and Entresto.  Otherwise just needs to work on diet and exercise to lose weight       Relevant Medications   carvedilol (COREG) 6.25 MG tablet   Other Relevant Orders   EKG 12-Lead (Completed)   MR CARDIAC MORPHOLOGY W WO CONTRAST   Ambulatory referral to Cardiac Electrophysiology  Basic metabolic panel   CBC     Other   Morbid obesity (HCC) (Chronic)    I stressed the importance of diet change and weight loss with exercise.  He has previously refused referral to obesity clinic with [facility].  We will continue to stress importance of making this step.       Relevant Orders   Basic metabolic panel   CBC    ===================================  HPI:    Alex Allen is a super morbidly obese 41 y.o. male with a PMH notable for NICM/combined CHF (EF of 30% on  Echo & nonobstructive CAD on Cath -March 2022), OSA-CPAP, nephrolithiasis who presents today for 1 month follow-up. at the request of [facility]  March 2022: Admitted with Hypertensive Emergency and Acute CHF, troponin ~1000.  Thought related to uncontrolled HTN, OSA with possible involvement of recent COVID infection and underlying LBBB. Echo EF 30% with basal and mid inferior and inferoseptal akinesis, anterior septal severe hypokinesis with moderate LVH. RLHC: Mild nonobstructive CAD, minimally elevated RVP, PAP Apparently, he had been on losartan and HCTZ for hypertension that was stopped by PCP the preceding year stating that his pressures normalized.  => Diuresed and started on GDMT: Entresto, carvedilol, spironolactone, Farxiga and standing dose Lasix with sliding scale. Recommended consideration of OP Cardiac MRI  Followed briefly by the [facility]: Carvedilol was increased to 6.25 mg twice daily at first visit.  Consider referral for OSA once insurance cleared up.  Weight was roughly 350 pounds (347 to 350 pounds).  Dyspnea improved with medical management.  He also stopped his intermittent alcohol consumption as well as smoking.  Was afraid to exert himself.  Still noticing feeling rundown during the day. => Considered [facility] to [facility]  He was seen for initial Cardiology Clinic hospital follow-up initially in May 2020, then again in August 2022 by Edd Fabian, NP.   At that time he was doing well with no cardiac complaints. His weight was down from 351 lbs to 336 lbs. he had definitely made dietary adjustments, eating less, using none salt seasonings.  Encouraged to increase activity level. He was continued on home medication regimen and plan was for repeat Echo; however, this was delayed due to insurance issues.  Recent Hospitalizations:  December 16, 2020: Urgent Care evaluation for nephrolithiasis. August 02, 2021: Urgent Care/ER  visit again for nephrolithiasis   Alex Allen was last seen on 10/05/2021 by Marjie Skiff, PA: " Here alone. Doing well from a cardiac standpoint. He reports some dyspnea on exertion when he was carrying a heavy backpack up a flight of steps a couple of months ago but usually does not have any dyspnea on exertion. No orthopnea or PND. He reports some indigestion after eating spicy foods or citrus foods but no chest pain. No palpitaitons, lightheadedness, dizziness, syncope.  His weight was 340 lbs, up 4lbs from last visit. He states he has had a very stressful past couple months due to multiple family members being sick so he has not been exercising or eating as well. He reports compliance with his medications and seems to be tolerating them well."  Felt to be euvolemic.  . Follow-up echocardiogram ordered along with Zio patch monitor to assess PVC burden..   No medication changes made Recommended weight loss with increased exercise. >  Offered [facility] to [facility]-patient declined.   Reviewed  CV studies:  The following studies were reviewed today: (if available, images/films reviewed: From Epic Chart or Care Everywhere) Echo 09/20/2020: EF~30%.  Moderate-severely reduced LV function.  Baseline admitted inferior and inferoseptal akinesis with severe anteroseptal hypokinesis.  Moderate LVH.  Indeterminate diastolic function with mild LA dilation.  Unable to assess PAP dilated IVC suggesting RAP 15 mmHg.   R&LHC 09/21/2020: Proximal LAD 10%, mid LAD 20%.  D1 50%.  Proximal LCx 30%, mid RCA 20% and RPDA 30%.  Very large caliber coronary arteries.  Minimally elevated right heart Pressures.  Fick CO-CI 7.1-2.6.   Echo 11/10/2021: EF <20% with severely reduced function.  Dilated LV.  Entire inferior wall, mid inferoseptal basilar septal wall are akinetic anterior and lateral wall are hypokinetic. Zio patch monitor 09/2021: Sinus rhythm rates 61 129 bpm, average 84 bpm.  No  PACs noted.  Occasional isolated PVCs (4.9%) and occasional (1.1%) couplets.  No bigeminy or trigeminy.  Interval History:   Alex Allen presents here today for 1 month follow-up to discuss results of his echocardiogram.  This is the first time of seeing him since leaving the hospital.  That was over a year ago.  He starts off the visit by indicating that he has not been doing as well as he had hoped with his dietary modifications.  He said there is been a lot of stress near the end of the year school.  He just has gotten off his diet over the last 3 to 4 months and gained about 10 pounds-up to 346 pounds from his baseline of 336 when he saw Edd Fabian last fall. 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  He says he is "able to move and grew ".  No symptoms out of the ordinary.  He may get a little short of breath walking up a hill, or carrying a heavy backpack, but has attributed this to being deconditioned and obese.  He denies any chest tightness or pressure with rest or exertion.  He does feel a few skipped beats here and there but nothing that is alarming or worrisome to him.  No prolonged episodes. Interestingly, he tells me that his father was recently diagnosed with CHF earlier this year.  He sleeps well using CPAP and denies any PND orthopnea-he may wake up once or twice overnight to urinate but otherwise gets a good 5 to 6 hours of sleep..  Minimal edema if any at all.  He does not think that the weight gain has been related to fluid because he thinks is more insidious over the last several months.  Has not noted any day in particular where the weight is gone up more than a half a pound or so.  He has not noted any change in his dyspnea or edema.  He thinks this is just basically because he got out of the habit of eating healthy.  He brought a synopsis of his blood pressure over the last week or so.  Low blood pressure was 103/67 high was 129/97.  Rates have  been in the 70s to the 90s.  CV Review of Symptoms (Summary) Cardiovascular ROS: positive for - dyspnea on exertion and irregular heartbeat negative for - chest pain, edema, orthopnea, palpitations, paroxysmal nocturnal dyspnea, rapid heart rate, shortness of breath, or lightheaded, dizzy, syncope/near syncope,TIA/amaurosis fugax,claudication.  REVIEWED OF SYSTEMS   Review of Systems  Constitutional:  Positive for malaise/fatigue (End of day fatigue, just tired because longer hours of work.). Negative  for weight loss.  HENT:  Negative for congestion and nosebleeds.   Respiratory:  Positive for shortness of breath (If he rushes up a hill or carrying something heavy.). Negative for cough.   Gastrointestinal:  Negative for abdominal pain, blood in stool and melena.  Genitourinary:  Positive for hematuria (Only when he had nephrolithiasis, not since the last episode.). Negative for dysuria.  Musculoskeletal:  Positive for joint pain. Negative for falls and myalgias.  Neurological:  Positive for dizziness (Sometimes if he bends over and stands up quickly.). Negative for focal weakness and weakness.  Psychiatric/Behavioral:  Negative for depression and memory loss. The patient is not nervous/anxious and does not have insomnia.    I have reviewed and (if needed) personally updated the patient's problem list, medications, allergies, past medical and surgical history, social and family history.   PAST MEDICAL HISTORY   Past Medical History:  Diagnosis Date   Chronic combined systolic and diastolic CHF (congestive heart failure) (HCC)    LVEF of 30% on Echo in 08/2020   Coronary artery disease, non-occlusive    non-obstructive CAD on LHC in 08/2020   Hypertension    LBBB (left bundle branch block)    Morbid obesity (HCC)    Nephrolithiasis 11/2020   Repeat visit in February 2023   Non-ischemic cardiomyopathy (HCC) 08/2020   Noted in the setting of hypertensive emergency.  EF 30% by echo.   Moderate severely reduced function.  Minimal CAD on [facility] with relatively normal RHC pressures.  Follow-up Echo May 2023-EF <20%.   Obstructive sleep apnea     PAST SURGICAL HISTORY   Past Surgical History:  Procedure Laterality Date   RIGHT/LEFT HEART CATH AND CORONARY ANGIOGRAPHY N/A 09/21/2020   Procedure: RIGHT/LEFT HEART CATH AND CORONARY ANGIOGRAPHY;  Surgeon: Lennette Bihari, MD;  Location: [facility] INVASIVE CV LAB:: Proximal LAD 10%, mid LAD 20%.  D1 50%.  Proximal LCx 30%, mid RCA 20% and RPDA 30%.  Very large caliber coronary arteries.  Minimally elevated right heart Pressures.  Fick CO-CI 7.1-2.6   TRANSTHORACIC ECHOCARDIOGRAM  09/20/2020   EF~30%.  Moderate-severely reduced LV function.  Baseline admitted inferior and inferoseptal akinesis with severe anteroseptal hypokinesis.  Moderate LVH.  Indeterminate diastolic function with mild LA dilation.  Unable to assess PAP dilated IVC suggesting RAP 15 mmHg.   TRANSTHORACIC ECHOCARDIOGRAM  11/10/2021   EF <20% with severely reduced function.  Dilated LV.  Entire inferior wall, mid inferoseptal basilar septal wall are akinetic anterior and lateral wall are hypokinetic.   ZIO PATCH MONITOR  09/2021   Sinus rhythm rates 61 129 bpm, average 84 bpm.  No PACs noted.  Occasional isolated PVCs (4.9%) and occasional (1.1%) couplets.  No bigeminy or trigeminy.    Immunization History  Administered Date(s) Administered   Tdap 05/08/2016    MEDICATIONS/ALLERGIES   Current Meds  Medication Sig   albuterol (VENTOLIN HFA) 108 (90 Base) MCG/ACT inhaler Inhale 2 puffs into the lungs every 6 (six) hours as needed for wheezing or shortness of breath.   aspirin (ASPIRIN ADULT LOW STRENGTH) 81 MG EC tablet Take 1 tablet (81 mg total) by mouth daily. Take 1 tablet (81mg ) ONCE daily.   atorvastatin (LIPITOR) 40 MG tablet TAKE 1 TABLET BY MOUTH DAILY.   dapagliflozin propanediol (FARXIGA) 10 MG TABS tablet Take 1 tablet (10 mg total) by mouth daily. Patient  need appointment for future refills.   furosemide (LASIX) 40 MG tablet TAKE 1 TABLET (40 MG TOTAL) BY MOUTH DAILY.  sacubitril-valsartan (ENTRESTO) 49-51 MG TAKE 1 TABLET BY MOUTH 2 TIMES DAILY.   spironolactone (ALDACTONE) 25 MG tablet TAKE 1 TABLET (25 MG TOTAL) BY MOUTH DAILY. PATIENT NEED A APPOINTMENT FOR FUTURE REFILLS.   [DISCONTINUED] carvedilol (COREG) 6.25 MG tablet Take 1 tablet (6.25 mg total) by mouth 2 (two) times daily with a meal.    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  OBJCTIVE -PE, EKG, labs   Wt Readings from Last 3 Encounters:  11/14/21 (!) 346 lb 12.8 oz (157.3 kg)  10/05/21 (!) 340 lb (154.2 kg)  02/07/21 (!) 336 lb 6.4 oz (152.6 kg)    Physical Exam: BP 122/70   Pulse 84   Ht  (1.753 m)   Wt (!) 346 lb 12.8 oz (157.3 kg)   SpO2 97%   BMI 51.21 kg/m  Physical Exam Vitals reviewed.  Constitutional:      General: He is not in acute distress.    Appearance: He is not ill-appearing or toxic-appearing.     Comments: Super morbidly obese, but well-groomed.  HENT:     Head: Normocephalic and atraumatic.  Eyes:     Extraocular Movements: Extraocular movements intact.     Conjunctiva/sclera: Conjunctivae normal.     Pupils: Pupils are equal, round, and reactive to light.     Comments: Wears glasses  Neck:     Vascular: No carotid bruit, hepatojugular reflux or JVD.   Cardiovascular:     Rate and Rhythm: Normal rate and regular rhythm. FrequentExtrasystoles are present.    Chest Wall: PMI is displaced (Difficult to palpate.  Seems somewhat laterally displaced but none sustained.).     Pulses: Decreased pulses (Difficult to palpate pedal pulses due to body habitus.).     Heart sounds: Heart sounds are distant. No murmur heard.   No friction rub.     Comments: Normal S1 with somewhat split S2.  Cannot exclude soft gallop, difficult because of distant heart sounds. Pulmonary:     Effort: Pulmonary effort is normal. No respiratory distress.     Breath sounds: Normal breath sounds. No wheezing, rhonchi or rales.  Chest:     Chest wall: No tenderness.  Abdominal:     General: Bowel sounds are normal. There is no distension.     Tenderness: There is no abdominal tenderness.     Comments: Obese.  Unable to really assess HSM.  Musculoskeletal:     Cervical back: Normal range of motion and neck supple.  Neurological:     General: No focal deficit present.     Mental Status: He is alert and oriented to person, place, and time.     Gait: Gait normal.  Psychiatric:        Mood and Affect: Mood normal.        Behavior: Behavior normal.  Thought Content: Thought content normal.        Judgment: Judgment normal.     Comments: I question his conceptualizing of what is going on with his heart.  He does not quite understand the prognosis of having an EF 20%.  We spent a long time discussing this.     Adult ECG Report  Rate: 84;  Rhythm: normal sinus rhythm, premature ventricular contractions (PVC), and LBBB and LVH with repolarization changes ;   Narrative Interpretation: Stable  Recent Labs:  09/09/2021  Na+ 136, K+ 3.8, Cl- 101, HCO3-29, BUN 13, Cr 0.7, Glu 116, Ca2+ 9.3 (corrected 8.93); AST 19 , ALT 18, AlkP 100 TC 83, TG 176, HDL 28, LDL cacv (NIH) 26; A1c 6.4  Lab Results  Component Value Date   CHOL 160 09/20/2020   HDL 34 (L) 09/20/2020    LDLCALC 111 (H) 09/20/2020   TRIG 74 09/20/2020   CHOLHDL 4.7 09/20/2020   Lab Results  Component Value Date   CREATININE 1.01 10/12/2020   BUN 13 10/12/2020   NA 137 10/12/2020   K 4.1 10/12/2020   CL 102 10/12/2020   CO2 27 10/12/2020      Latest Ref Rng & Units 09/22/2020    4:39 AM 09/21/2020   11:28 AM 09/21/2020   11:14 AM  CBC  WBC 4.0 - 10.5 K/uL 11.7      Hemoglobin 13.0 - 17.0 g/dL 53.6   14.4   31.5     15.6    Hematocrit 39.0 - 52.0 % 46.1   46.0   47.0     46.0    Platelets 150 - 400 K/uL 214        Lab Results  Component Value Date   HGBA1C 7.1 (H) 09/19/2020   Lab Results  Component Value Date   TSH 1.62 05/08/2016    ==================================================  COVID-19 Education: The signs and symptoms of COVID-19 were discussed with the patient and how to seek care for testing (follow up with PCP or arrange E-visit).    I spent a total of 45 minutes with the patient spent in direct patient consultation.  Additional time spent with chart review  / charting (studies, outside notes, etc): 40 min => Personally reviewed Films and both echoes as well as Zio patch monitor. Total Time: 85 min  Current medicines are reviewed at length with the patient today.  (+/- concerns) N/A  This visit occurred during the SARS-CoV-2 public health emergency.  Safety protocols were in place, including screening questions prior to the visit, additional usage of staff PPE, and extensive cleaning of exam room while observing appropriate contact time as indicated for disinfecting solutions.  Notice: This dictation was prepared with Dragon dictation along with smart phrase technology. Any transcriptional errors that result from this process are unintentional and may not be corrected upon review.  Studies Ordered:   Orders Placed This Encounter  Procedures   MR CARDIAC MORPHOLOGY W WO CONTRAST   Basic metabolic panel   CBC   Ambulatory referral to Cardiac  Electrophysiology   EKG 12-Lead   Meds ordered this encounter  Medications   carvedilol (COREG) 6.25 MG tablet    Sig: Take 1.5 tablets (9.375 mg total) by mouth 2 (two) times daily with a meal.    Dispense:  270 tablet    Refill:  3    Please cancel all previous orders for current medication. Change in dosage or pill size.    Patient Instructions /  Medication Changes & Studies & Tests Ordered   Patient Instructions  Medication Instructions:   Increase  taking Carvedilol 9.375 mg ( 1 and 1/2 tablets) twice a day    *If you need a refill on your cardiac medications before your next appointment, please call your pharmacy*   Lab Work: CBC  `` bmp If you have labs (blood work) drawn today and your tests are completely normal, you will receive your results only by: MyChart Message (if you have MyChart) OR A paper copy in the mail If you have any lab test that is abnormal or we need to change your treatment, we will call you to review the results.   Testing/Procedures:  Will be schedule at [facility]  Your physician has requested that you have a cardiac MRI. Cardiac MRI uses a computer to create images of your heart as its beating, producing both still and moving pictures of your heart and major blood vessels.  Please follow the instruction sheet given to you today for more information.    Follow-Up: At [facility], you and your health needs are our priority.  As part of our continuing mission to provide you with exceptional heart care, we have created designated Provider Care Teams.  These Care Teams include your primary Cardiologist (physician) and Advanced Practice Providers (APPs -  Physician Assistants and Nurse Practitioners) who all work together to provide you with the care you need, when you need it.     Your next appointment:   5 month(s)  The format for your next appointment:   In Person  Provider:   Bryan Lemma, MD    Other Instructions      Bryan Lemma, M.D., M.S. Interventional Cardiologist   Pager # [phone] Phone # [phone] [address] [place], [place] [address]   Thank you for choosing Heartcare at [facility]!!

## 2021-11-14 NOTE — Patient Instructions (Addendum)
Medication Instructions:   Increase  taking Carvedilol 9.375 mg ( 1 and 1/2 tablets) twice a day    *If you need a refill on your cardiac medications before your next appointment, please call your pharmacy*   Lab Work: CBC  `` bmp If you have labs (blood work) drawn today and your tests are completely normal, you will receive your results only by: MyChart Message (if you have MyChart) OR A paper copy in the mail If you have any lab test that is abnormal or we need to change your treatment, we will call you to review the results.   Testing/Procedures:  Will be schedule at [facility]  Your physician has requested that you have a cardiac MRI. Cardiac MRI uses a computer to create images of your heart as its beating, producing both still and moving pictures of your heart and major blood vessels.  Please follow the instruction sheet given to you today for more information.    Follow-Up: At [facility], you and your health needs are our priority.  As part of our continuing mission to provide you with exceptional heart care, we have created designated Provider Care Teams.  These Care Teams include your primary Cardiologist (physician) and Advanced Practice Providers (APPs -  Physician Assistants and Nurse Practitioners) who all work together to provide you with the care you need, when you need it.     Your next appointment:   5 month(s)  The format for your next appointment:   In Person  Provider:   Bryan Lemma, MD    Other Instructions

## 2021-11-18 ENCOUNTER — Encounter (HOSPITAL_BASED_OUTPATIENT_CLINIC_OR_DEPARTMENT_OTHER): Payer: Self-pay

## 2021-11-18 ENCOUNTER — Ambulatory Visit (HOSPITAL_BASED_OUTPATIENT_CLINIC_OR_DEPARTMENT_OTHER): Admit: 2021-11-18 | Payer: BC Managed Care – PPO | Admitting: Urology

## 2021-11-18 ENCOUNTER — Encounter: Payer: Self-pay | Admitting: Cardiology

## 2021-11-18 SURGERY — CYSTOSCOPY/URETEROSCOPY/HOLMIUM LASER/STENT PLACEMENT
Anesthesia: General | Laterality: Left

## 2021-11-18 NOTE — Assessment & Plan Note (Addendum)
Minimal CAD with significant reduction in EF from initial echo despite being on guideline directed medical therapy.  He is a little lighter than he was last year, but has not successfully lost much weight.  Plan from the beginning was to consider cardiac MRI and now that has been well over a year with no improved EF and underlying lipomatous block will need referral to EP to consider CRT-D Really minimal symptoms.  Unfortunately EF has dropped despite guideline directed medical therapy.  This would suggest underlying etiology.  With regional wall motion normality's in the absence of CAD, need to more closely evaluate cardiomyopathy.  Persistent LBBB with moderate PVC burden could well be playing a role.    Plan EP referral for possible CRT-D and potential antiarrhythmic for PVCs.  Titrate up beta-blocker slowly

## 2021-11-18 NOTE — Assessment & Plan Note (Signed)
I stressed the importance of diet change and weight loss with exercise.  He has previously refused referral to obesity clinic with [facility].  We will continue to stress importance of making this step.

## 2021-11-18 NOTE — Assessment & Plan Note (Signed)
Minimal CAD by cath.  On aspirin and statin as well as beta-blocker and Entresto.  Otherwise just needs to work on diet and exercise to lose weight

## 2021-11-18 NOTE — Assessment & Plan Note (Addendum)
Based on his symptoms and the fact that he is relatively euvolemic exam, really NYHA Class IIa if not Class Isymptoms.  Plan:  Continue current meds as is but increase carvedilol to 1.5 tab (9.375 mg) twice daily -> would like to try to go elevated 12.5 twice daily as long as he can tolerate.  Entresto 49 to 51 mg twice daily-blood pressure probably will tolerate titration further at this point.  Spironolactone 25 mg daily  Farxiga 10 mg daily  Continue Lasix with sliding scale-stressed importance of daily weights  Continue to encourage getting back onto the diet control regimen with weight loss.  Needs to be more active.  Hopefully over the summer this can start a new trend  Check cardiac MRI.  EP referral for possible CRT-D

## 2021-11-19 ENCOUNTER — Other Ambulatory Visit: Payer: Self-pay | Admitting: Cardiology

## 2021-11-24 ENCOUNTER — Telehealth: Payer: Self-pay | Admitting: Cardiology

## 2021-11-24 NOTE — Telephone Encounter (Signed)
PA submitted via Covermymeds  Key: [id number]  Will place samples at front desk until PA approved.    Patient aware.

## 2021-11-24 NOTE — Telephone Encounter (Signed)
Patient called stating he needs a prior auth for his Entresto.  After these evening he will have no medication left.

## 2021-12-08 ENCOUNTER — Telehealth: Payer: Self-pay | Admitting: Cardiology

## 2021-12-08 NOTE — Telephone Encounter (Signed)
Called pt, he states "I put it in 14 days ago and I don't know what the hold up is. I;m on my last 2 pills today." Will get message to Dr. Elissa Hefty nurse for review.

## 2021-12-08 NOTE — Telephone Encounter (Signed)
Pt c/o medication issue:  1. Name of Medication:   sacubitril-valsartan (ENTRESTO) 49-51 MG    2. How are you currently taking this medication (dosage and times per day)?  TAKE 1 TABLET BY MOUTH 2 TIMES DAILY.  3. Are you having a reaction (difficulty breathing--STAT)? no  4. What is your medication issue? Pt states that the medication requires prior authorization. Patient has one day left of medication

## 2021-12-09 NOTE — Telephone Encounter (Signed)
Patient is following up, again requesting to speak with Dr. Elissa Hefty nurse regarding a PA requested on 6/01. Patient states he already missed his morning dose. He states he would like a status update if possible. RN unavailable for transfer. Please advise.

## 2021-12-09 NOTE — Telephone Encounter (Signed)
Patient is calling back frustrated that he has not been contacted by Dr. Elissa Hefty nurse regarding this PA he has been requesting since 11/24/21. Patient is completely out of medication and is concerned due to the pharmacy advising the PA can take up to 3 days once submitted. Patient states if PA for this medication is this hard to come by he would like to discuss an alternative medication with Dr. Herbie Baltimore that will not require PA. Patient is requesting a callback from [place] today after 10:00 AM due to him having an appointment this morning. Please advise.

## 2021-12-09 NOTE — Telephone Encounter (Signed)
Call transferred to RN.

## 2021-12-09 NOTE — Telephone Encounter (Signed)
Spoke to patient .  Informed patient re applying for prior authorization - denied x2 - [facility]  Patient aware and will come by the office for samples.

## 2021-12-13 ENCOUNTER — Encounter: Payer: Self-pay | Admitting: Cardiology

## 2021-12-13 ENCOUNTER — Ambulatory Visit: Payer: BC Managed Care – PPO | Admitting: Cardiology

## 2021-12-13 VITALS — BP 112/74 | HR 90 | Ht 69.0 in | Wt 344.2 lb

## 2021-12-13 DIAGNOSIS — I5022 Chronic systolic (congestive) heart failure: Secondary | ICD-10-CM | POA: Diagnosis not present

## 2021-12-13 NOTE — Patient Instructions (Signed)
Medication Instructions:  Your physician recommends that you continue on your current medications as directed. Please refer to the Current Medication list given to you today.  *If you need a refill on your cardiac medications before your next appointment, please call your pharmacy*   Lab Work: None ordered If you have labs (blood work) drawn today and your tests are completely normal, you will receive your results only by: MyChart Message (if you have MyChart) OR A paper copy in the mail If you have any lab test that is abnormal or we need to change your treatment, we will call you to review the results.   Testing/Procedures: None ordered                      Follow-Up: At [facility], you and your health needs are our priority.  As part of our continuing mission to provide you with exceptional heart care, we have created designated Provider Care Teams.  These Care Teams include your primary Cardiologist (physician) and Advanced Practice Providers (APPs -  Physician Assistants and Nurse Practitioners) who all work together to provide you with the care you need, when you need it.  Your next appointment:   To be   determined after Dr. Elberta Fortis reviews your upcoming cardiac MRI   The format for your next appointment:   In Person  Provider:   Loman Brooklyn, MD{    Thank you for choosing [facility]!!   Dory Horn, RN [phone]  Other Instructions    Important Information About Sugar

## 2021-12-13 NOTE — Progress Notes (Signed)
Electrophysiology Office Note   Date:  12/13/2021   ID:  MESHILEM Allen, DOB 1981-04-11, MRN [id number]  PCP:  [facility] [place]  Cardiologist:  Herbie Baltimore Primary Electrophysiologist:  Manish Ruggiero Jorja Loa, MD    Chief Complaint: CHF   History of Present Illness: Alex Allen is a 41 y.o. male who is being seen today for the evaluation of CHF at the request of Marykay Lex, MD. Presenting today for electrophysiology evaluation.  He has a history significant for chronic systolic heart failure due to nonischemic cardiomyopathy, morbid obesity, obstructive sleep apnea, hypertension.  He had a recent transthoracic echo that showed an ejection fraction of less than 20%.  He also has left bundle branch block.  He has had a left heart catheterization that showed no obstructive coronary artery disease.  Today, he denies symptoms of palpitations, chest pain, shortness of breath, orthopnea, PND, lower extremity edema, claudication, dizziness, presyncope, syncope, bleeding, or neurologic sequela. The patient is tolerating medications without difficulties.  Today he feels well.  He has no chest pain or shortness of breath.  He is able to walk up to 2 miles a day.  He has been working to lose weight.  He has a Psychologist, educational.  He has been eating less salt   Past Medical History:  Diagnosis Date   Chronic combined systolic and diastolic CHF (congestive heart failure) (HCC)    LVEF of 30% on Echo in 08/2020   Coronary artery disease, non-occlusive    non-obstructive CAD on LHC in 08/2020   Hypertension    LBBB (left bundle branch block)    Morbid obesity (HCC)    Nephrolithiasis 11/2020   Repeat visit in February 2023   Non-ischemic cardiomyopathy (HCC) 08/2020   Noted in the setting of hypertensive emergency.  EF 30% by echo.  Moderate severely reduced function.  Minimal CAD on [facility] with relatively normal RHC pressures.  Follow-up Echo May 2023-EF <20%.   Obstructive sleep  apnea    Past Surgical History:  Procedure Laterality Date   RIGHT/LEFT HEART CATH AND CORONARY ANGIOGRAPHY N/A 09/21/2020   Procedure: RIGHT/LEFT HEART CATH AND CORONARY ANGIOGRAPHY;  Surgeon: Lennette Bihari, MD;  Location: [facility] INVASIVE CV LAB:: Proximal LAD 10%, mid LAD 20%.  D1 50%.  Proximal LCx 30%, mid RCA 20% and RPDA 30%.  Very large caliber coronary arteries.  Minimally elevated right heart Pressures.  Fick CO-CI 7.1-2.6   TRANSTHORACIC ECHOCARDIOGRAM  09/20/2020   EF~30%.  Moderate-severely reduced LV function.  Baseline admitted inferior and inferoseptal akinesis with severe anteroseptal hypokinesis.  Moderate LVH.  Indeterminate diastolic function with mild LA dilation.  Unable to assess PAP dilated IVC suggesting RAP 15 mmHg.   TRANSTHORACIC ECHOCARDIOGRAM  11/10/2021   EF <20% with severely reduced function.  Dilated LV.  Entire inferior wall, mid inferoseptal basilar septal wall are akinetic anterior and lateral wall are hypokinetic.   ZIO PATCH MONITOR  09/2021   Sinus rhythm rates 61 129 bpm, average 84 bpm.  No PACs noted.  Occasional isolated PVCs (4.9%) and occasional (1.1%) couplets.  No bigeminy or trigeminy.     Current Outpatient Medications  Medication Sig Dispense Refill   aspirin (ASPIRIN ADULT LOW STRENGTH) 81 MG EC tablet Take 1 tablet (81 mg total) by mouth daily. Take 1 tablet (81mg ) ONCE daily. 90 tablet 3   atorvastatin (LIPITOR) 40 MG tablet TAKE 1 TABLET BY MOUTH DAILY. 90 tablet 2   carvedilol (COREG) 6.25 MG tablet  Take 1.5 tablets (9.375 mg total) by mouth 2 (two) times daily with a meal. 270 tablet 3   dapagliflozin propanediol (FARXIGA) 10 MG TABS tablet Take 1 tablet (10 mg total) by mouth daily. Patient need appointment for future refills. 90 tablet 0   furosemide (LASIX) 40 MG tablet TAKE 1 TABLET (40 MG TOTAL) BY MOUTH DAILY. 90 tablet 2   sacubitril-valsartan (ENTRESTO) 49-51 MG TAKE 1 TABLET BY MOUTH 2 TIMES DAILY. 60 tablet 11   spironolactone  (ALDACTONE) 25 MG tablet TAKE 1 TABLET (25 MG TOTAL) BY MOUTH DAILY. PATIENT NEED A APPOINTMENT FOR FUTURE REFILLS. 90 tablet 3   albuterol (VENTOLIN HFA) 108 (90 Base) MCG/ACT inhaler Inhale 2 puffs into the lungs every 6 (six) hours as needed for wheezing or shortness of breath. (Patient not taking: Reported on 12/13/2021) 8 g 2   No current facility-administered medications for this visit.    Allergies:   Patient has no known allergies.   Social History:  The patient  reports that he quit smoking about 15 months ago. His smoking use included cigars. He has never used smokeless tobacco. He reports current alcohol use. He reports that he does not use drugs.   Family History:  The patient's family history includes Diabetes in his father; Heart failure in his father; Hyperlipidemia in his father; Hypertension in his father; Thyroid disease in his mother.    ROS:  Please see the history of present illness.   Otherwise, review of systems is positive for none.   All other systems are reviewed and negative.    PHYSICAL EXAM: VS:  BP 112/74   Pulse 90   Ht 5\' 9"  (1.753 m)   Wt (!) 344 lb 3.2 oz (156.1 kg)   SpO2 96%   BMI 50.83 kg/m  , BMI Body mass index is 50.83 kg/m. GEN: Well nourished, well developed, in no acute distress  HEENT: normal  Neck: no JVD, carotid bruits, or masses Cardiac: RRR; no murmurs, rubs, or gallops,no edema  Respiratory:  clear to auscultation bilaterally, normal work of breathing GI: soft, nontender, nondistended, + BS MS: no deformity or atrophy  Skin: warm and dry Neuro:  Strength and sensation are intact Psych: euthymic mood, full affect  EKG:  EKG is ordered today. Personal review of the ekg ordered shows sinus rhythm, left bundle branch block  Recent Labs: No results found for requested labs within last 365 days.    Lipid Panel     Component Value Date/Time   CHOL 160 09/20/2020 0701   TRIG 74 09/20/2020 0701   HDL 34 (L) 09/20/2020 0701    CHOLHDL 4.7 09/20/2020 0701   VLDL 15 09/20/2020 0701   LDLCALC 111 (H) 09/20/2020 0701     Wt Readings from Last 3 Encounters:  12/13/21 (!) 344 lb 3.2 oz (156.1 kg)  11/14/21 (!) 346 lb 12.8 oz (157.3 kg)  10/05/21 (!) 340 lb (154.2 kg)      Other studies Reviewed: Additional studies/ records that were reviewed today include: TTE 11/10/21  Review of the above records today demonstrates:     ASSESSMENT AND PLAN:  1.  Chronic systolic heart failure due to nonischemic cardiomyopathy: Currently on optimal medical therapy including carvedilol 9.375 mg twice daily, Farxiga 10 mg daily, Entresto 49/51 mg twice daily, Aldactone 25 mg daily.  Unfortunately his ejection fraction remains low.  He would likely benefit from ICD therapy.  That being said, he has plans for cardiac MRI.  We Matvey Llanas await  for the results of his MRI prior to ICD implant.  He has a left bundle branch block on his ECG with other ECG showing sinus rhythm and no left bundle branch block.  He may benefit from CRT-D at ICD implant.  2.  Hyperlipidemia: Continue atorvastatin 80 mg per primary cardiology  3.  PVCs: 6% burden on cardiac monitor.  He is not having any PVCs today.  We Weslie Rasmus continue with his current management.  If his PVC burden increases, he would likely need antiarrhythmic medications.  Case discussed with primary cardiology  Current medicines are reviewed at length with the patient today.   The patient does not have concerns regarding his medicines.  The following changes were made today:  none  Labs/ tests ordered today include:  Orders Placed This Encounter  Procedures   EKG 12-Lead     Disposition:   FU with Chianne Byrns 3 months  Signed, Alexea Blase Jorja Loa, MD  12/13/2021 8:54 AM     [facility] [address] [place] [address] [phone] (office) [phone] (fax)

## 2021-12-17 ENCOUNTER — Other Ambulatory Visit: Payer: Self-pay | Admitting: Cardiology

## 2022-01-20 ENCOUNTER — Telehealth (HOSPITAL_COMMUNITY): Payer: Self-pay | Admitting: *Deleted

## 2022-01-20 NOTE — Telephone Encounter (Signed)
Reaching out to patient to offer assistance regarding upcoming cardiac imaging study; pt verbalizes understanding of appt date/time, parking situation and where to check in, and verified current allergies; name and call back number provided for further questions should they arise  Lendora Keys RN Navigator Cardiac Imaging [facility] [phone] office [phone] cell  Patient denies metal or claustrophobia. 

## 2022-01-23 ENCOUNTER — Ambulatory Visit (HOSPITAL_COMMUNITY)
Admission: RE | Admit: 2022-01-23 | Discharge: 2022-01-23 | Disposition: A | Discharge status: Home / Self Care | Payer: BC Managed Care – PPO | Source: Ambulatory Visit | Attending: Cardiology | Admitting: Cardiology

## 2022-01-23 DIAGNOSIS — I2510 Atherosclerotic heart disease of native coronary artery without angina pectoris: Secondary | ICD-10-CM | POA: Diagnosis not present

## 2022-01-23 DIAGNOSIS — I428 Other cardiomyopathies: Secondary | ICD-10-CM | POA: Insufficient documentation

## 2022-01-23 DIAGNOSIS — I493 Ventricular premature depolarization: Secondary | ICD-10-CM | POA: Diagnosis not present

## 2022-01-23 DIAGNOSIS — I5042 Chronic combined systolic (congestive) and diastolic (congestive) heart failure: Secondary | ICD-10-CM | POA: Diagnosis present

## 2022-01-23 MED ORDER — GADOBUTROL 1 MMOL/ML IV SOLN
10.0000 mL | Freq: Once | INTRAVENOUS | Status: AC | PRN
  Administered 2022-01-23: 10 mL via INTRAVENOUS

## 2022-01-27 ENCOUNTER — Encounter (HOSPITAL_COMMUNITY): Payer: Self-pay | Admitting: Cardiology

## 2022-01-27 ENCOUNTER — Telehealth (HOSPITAL_COMMUNITY): Payer: Self-pay

## 2022-01-27 ENCOUNTER — Ambulatory Visit (HOSPITAL_COMMUNITY)
Admission: RE | Admit: 2022-01-27 | Discharge: 2022-01-27 | Disposition: A | Discharge status: Home / Self Care | Payer: BC Managed Care – PPO | Source: Ambulatory Visit | Attending: Cardiology | Admitting: Cardiology

## 2022-01-27 VITALS — BP 104/70 | HR 83 | Wt 342.8 lb

## 2022-01-27 DIAGNOSIS — I447 Left bundle-branch block, unspecified: Secondary | ICD-10-CM | POA: Diagnosis not present

## 2022-01-27 DIAGNOSIS — E669 Obesity, unspecified: Secondary | ICD-10-CM | POA: Insufficient documentation

## 2022-01-27 DIAGNOSIS — I428 Other cardiomyopathies: Secondary | ICD-10-CM | POA: Diagnosis not present

## 2022-01-27 DIAGNOSIS — E782 Mixed hyperlipidemia: Secondary | ICD-10-CM | POA: Diagnosis not present

## 2022-01-27 DIAGNOSIS — Z8616 Personal history of COVID-19: Secondary | ICD-10-CM | POA: Insufficient documentation

## 2022-01-27 DIAGNOSIS — G4733 Obstructive sleep apnea (adult) (pediatric): Secondary | ICD-10-CM | POA: Insufficient documentation

## 2022-01-27 DIAGNOSIS — E785 Hyperlipidemia, unspecified: Secondary | ICD-10-CM | POA: Insufficient documentation

## 2022-01-27 DIAGNOSIS — Z6841 Body Mass Index (BMI) 40.0 and over, adult: Secondary | ICD-10-CM | POA: Diagnosis not present

## 2022-01-27 DIAGNOSIS — I2510 Atherosclerotic heart disease of native coronary artery without angina pectoris: Secondary | ICD-10-CM | POA: Insufficient documentation

## 2022-01-27 DIAGNOSIS — Z7984 Long term (current) use of oral hypoglycemic drugs: Secondary | ICD-10-CM | POA: Diagnosis not present

## 2022-01-27 DIAGNOSIS — I5022 Chronic systolic (congestive) heart failure: Secondary | ICD-10-CM | POA: Diagnosis not present

## 2022-01-27 DIAGNOSIS — Z79899 Other long term (current) drug therapy: Secondary | ICD-10-CM | POA: Diagnosis not present

## 2022-01-27 DIAGNOSIS — I110 Hypertensive heart disease with heart failure: Secondary | ICD-10-CM | POA: Insufficient documentation

## 2022-01-27 DIAGNOSIS — Z8249 Family history of ischemic heart disease and other diseases of the circulatory system: Secondary | ICD-10-CM | POA: Diagnosis not present

## 2022-01-27 DIAGNOSIS — I5042 Chronic combined systolic (congestive) and diastolic (congestive) heart failure: Secondary | ICD-10-CM

## 2022-01-27 LAB — BASIC METABOLIC PANEL
Anion gap: 9 (ref 5–15)
BUN: 12 mg/dL (ref 6–20)
CO2: 27 mmol/L (ref 22–32)
Calcium: 9.5 mg/dL (ref 8.9–10.3)
Chloride: 104 mmol/L (ref 98–111)
Creatinine, Ser: 0.99 mg/dL (ref 0.61–1.24)
GFR, Estimated: >60 mL/min (ref >60)
Glucose, Bld: 113 mg/dL — ABNORMAL HIGH (ref 70–99)
Potassium: 4.3 mmol/L (ref 3.5–5.1)
Sodium: 140 mmol/L (ref 135–145)

## 2022-01-27 LAB — BRAIN NATRIURETIC PEPTIDE: B Natriuretic Peptide: 34.8 pg/mL (ref 0.0–100.0)

## 2022-01-27 LAB — LIPID PANEL
Cholesterol: 87 mg/dL (ref 0–200)
HDL: 29 mg/dL — ABNORMAL LOW (ref >40)
LDL Cholesterol: 41 mg/dL (ref 0–99)
Total CHOL/HDL Ratio: 3 RATIO
Triglycerides: 86 mg/dL (ref <150)
VLDL: 17 mg/dL (ref 0–40)

## 2022-01-27 MED ORDER — CARVEDILOL 12.5 MG PO TABS: 12.5000 mg | ORAL_TABLET | Freq: Two times a day (BID) | ORAL | 3 refills | Status: DC

## 2022-01-27 NOTE — Progress Notes (Signed)
Blood collected for TTR genetic testing per Dr McLean.  Order form completed and both shipped by Fed[employer] to [facility].  

## 2022-01-27 NOTE — Telephone Encounter (Addendum)
Pt aware, agreeable, and verbalized understanding   ----- Message from Laurey Morale, MD sent at 01/26/2022 12:20 PM EDT ----- Agree, we will work on getting him in to see me.  Isaiyah Feldhaus/others, someone please schedule.   ----- Message ----- From: Marykay Lex, MD Sent: 01/25/2022  10:53 PM EDT To: Laurey Morale, MD; Tobin Chad, RN; #  Cardiac MRI results:  Confirms severe left ventricular dilation with an ejection fraction/EF (how much the heart pumps out compared to how much it fills up with) of 26% (normal range is 50 to 70%)..  There is wall motion abnormality consistent with a left bundle branch dyssynchrony. There is also mildly decreased function of the right ventricle with ejection fraction of 38%.  There is only nonspecific findings elsewhere the mostly are usually seen with elevated blood pressures in the heart as well as increased fluid volume.   Overall these findings indicate that there does not appear to be another cause for the cardiomyopathy i.e. an infiltrative condition like sarcoidosis or amyloidosis.  At this point I think we can probably consider proceeding with evaluation for cardiac resynchronization therapy-CRT with defibrillator/ICD (Bi-Ventricular ICD).  I also truthfully believe that this gentleman with a significant reduced pump function would benefit from being evaluated advanced heart failure clinic.  I have attached Dr. Shirlee Latch (who read the study).  Bryan Lemma, MD

## 2022-01-27 NOTE — Patient Instructions (Signed)
Increase Carvedilol to 12.5mg  Twice daily  Labs done today, your results will be available in MyChart, we will contact you for abnormal readings.  Genetic test has been done, this has to be sent to [place] to be processed and can take 1-2 weeks to get results back.  We will let you know the results.  You have been referred to the Cardiac Electrophysiologist. They will call you to arrange the appointment.  Your physician recommends that you schedule a follow-up appointment in: 6 weeks   If you have any questions or concerns before your next appointment please send [place] a message through [place] or call our office at [phone].    TO LEAVE A MESSAGE FOR THE NURSE SELECT OPTION 2, PLEASE LEAVE A MESSAGE INCLUDING: YOUR NAME DATE OF BIRTH CALL BACK NUMBER REASON FOR CALL**this is important as we prioritize the call backs  YOU WILL RECEIVE A CALL BACK THE SAME DAY AS LONG AS YOU CALL BEFORE 4:00 PM  At the [facility], you and your health needs are our priority. As part of our continuing mission to provide you with exceptional heart care, we have created designated Provider Care Teams. These Care Teams include your primary Cardiologist (physician) and Advanced Practice Providers (APPs- Physician Assistants and Nurse Practitioners) who all work together to provide you with the care you need, when you need it.   You may see any of the following providers on your designated Care Team at your next follow up: Dr Arvilla Meres Dr Carron Curie, NP Robbie Lis, [place] [facility] [place], [place] Karle Plumber, PharmD   Please be sure to bring in all your medications bottles to every appointment.

## 2022-01-28 NOTE — Progress Notes (Signed)
PCP: [facility] [place] Cardiology: Dr. Herbie Baltimore HF Cardiology: Dr. Shirlee Latch  41 y.o. with history of OSA, HTN, and chronic systolic CHF was referred by Dr. Herbie Baltimore for evaluation of CHF.  Patient was found to have a cardiomyopathy in 2022. He reports a COVID-19 infection in 12/21, shortly before CHF symptoms developed.   In 3/22, echo showed EF 30%.  Left/right heart cath in 3/22 showed mild nonobstructive CAD, CI 2.6.  Repeat echo in 5/23 showed nonobstructive CAD, CI 2.6.  Cardiac MRI in 7/23 showed EF 26%, septal-lateral dyssynchrony, RVEF 38%, nonspecific RV insertion site LGE.  Patient rarely drinks ETOH and does not use drugs.  His father was noted to have CHF around age 27.    He is currently doing well.  He walks for exercise and weight is coming down.  No lightheadedness.  No exertional dyspnea or chest pain.  He can walk up to 3 miles with no problems.  He works as a Systems developer at [employer].   ECG (personally reviewed): NSR, LBBB 184 msec  Labs (4/22): K 4.1, creatinine 1.01  PMH: 1. Obesity 2. OSA: Uses CPAP 3. HTN 4. H/o nephrolithiasis 5. H/o COVID-19 in 12/21 6. Chronic systolic CHF: Nonischemic cardiomyopathy.   - Echo (3/22): EF 30% - LHC/RHC (3/22) with nonobstructive CAD, CI 2.6.  - Zio monitor (5/23): 6% PVCs - Echo (5/23): nonobstructive CAD, CI 2.6.  - Cardiac MRI (7/23): EF 26%, septal-lateral dyssynchrony, RVEF 38%, nonspecific RV insertion site LGE.   SH: Works as Systems developer at [employer].  No ETOH, no drugs, no smoking.   FH: Father with CHF at 54.   ROS: All systems reviewed and negative except as per HPI.   Current Outpatient Medications  Medication Sig Dispense Refill   albuterol (VENTOLIN HFA) 108 (90 Base) MCG/ACT inhaler Inhale 2 puffs into the lungs every 6 (six) hours as needed for wheezing or shortness of breath. 8 g 2   aspirin (ASPIRIN ADULT LOW STRENGTH) 81 MG EC tablet Take 1 tablet (81 mg total)  by mouth daily. Take 1 tablet (81mg ) ONCE daily. 90 tablet 3   atorvastatin (LIPITOR) 40 MG tablet TAKE 1 TABLET BY MOUTH DAILY. 90 tablet 2   dapagliflozin propanediol (FARXIGA) 10 MG TABS tablet Take 1 tablet (10 mg total) by mouth daily. 90 tablet 0   furosemide (LASIX) 40 MG tablet TAKE 1 TABLET (40 MG TOTAL) BY MOUTH DAILY. 90 tablet 2   sacubitril-valsartan (ENTRESTO) 49-51 MG TAKE 1 TABLET BY MOUTH 2 TIMES DAILY. 60 tablet 11   spironolactone (ALDACTONE) 25 MG tablet Take 1 tablet (25 mg total) by mouth daily. 90 tablet 0   carvedilol (COREG) 12.5 MG tablet Take 1 tablet (12.5 mg total) by mouth 2 (two) times daily with a meal. 180 tablet 3   No current facility-administered medications for this encounter.   BP 104/70   Pulse 83   Wt (!) 155.5 kg (342 lb 12.8 oz)   SpO2 96%   BMI 50.62 kg/m  General: NAD, obese.  Neck: No JVD, no thyromegaly or thyroid nodule.  Lungs: Clear to auscultation bilaterally with normal respiratory effort. CV: Nondisplaced PMI.  Heart regular S1/S2, no S3/S4, no murmur.  No peripheral edema.  No carotid bruit.  Normal pedal pulses.  Abdomen: Soft, nontender, no hepatosplenomegaly, no distention.  Skin: Intact without lesions or rashes.  Neurologic: Alert and oriented x 3.  Psych: Normal affect. Extremities: No clubbing or  cyanosis.  HEENT: Normal.   Assessment/Plan: 1. Chronic systolic CHF: Nonischemic cardiomyopathy.  He reports a COVID-19 infection in 12/21, shortly before CHF symptoms developed.   In 3/22, echo showed EF 30%.  Left/right heart cath in 3/22 showed mild nonobstructive CAD, CI 2.6.  Repeat echo in 5/23 showed nonobstructive CAD, CI 2.6.  Cardiac MRI in 7/23 showed EF 26%, septal-lateral dyssynchrony, RVEF 38%, nonspecific RV insertion site LGE.  Rare ETOH, no drugs.  Father with CHF diagnosed at 11.  cMRI did not show LGE in a myocarditis-type pattern, but cannot fully rule this out.  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  I do not think that his HTN  was ever bad enough to have caused LV systolic function to drop.  He is not volume overloaded on exam, NYHA class I-II.  - Continue Lasix 40 mg daily, BMET today.  - Increase Coreg to 12.5 mg bid.  - Continue Entresto 49/51 bid.  - Continue spironolactone 25 daily.  - Continue Farxiga 10 mg daily.  - I will order [facility] gene testing for familial cardiomyopathies.  We discussed the pros and cons of this.  - Arrange appointment with EP, he has a wide LBBB and would likely benefit from CRT with a left bundle lead or a CS lead.  - Continue med titration.  2. OSA: Using CPAP.  3. Obesity: Weight is trending down.  Would consider semaglutide or dulaglutide in the future.   Followup with me in 6 wks.   Marca Ancona. 01/28/2022

## 2022-03-14 ENCOUNTER — Encounter: Payer: Self-pay | Admitting: Cardiology

## 2022-03-14 ENCOUNTER — Encounter (HOSPITAL_COMMUNITY): Payer: Self-pay | Admitting: Cardiology

## 2022-03-14 ENCOUNTER — Encounter: Payer: Self-pay | Admitting: *Deleted

## 2022-03-14 ENCOUNTER — Ambulatory Visit: Payer: BC Managed Care – PPO | Admitting: Cardiology

## 2022-03-14 ENCOUNTER — Ambulatory Visit (HOSPITAL_COMMUNITY)
Admission: RE | Admit: 2022-03-14 | Discharge: 2022-03-14 | Disposition: A | Discharge status: Home / Self Care | Payer: BC Managed Care – PPO | Source: Ambulatory Visit | Attending: Cardiology | Admitting: Cardiology

## 2022-03-14 VITALS — BP 98/60 | HR 88 | Wt 346.2 lb

## 2022-03-14 VITALS — BP 100/74 | HR 86 | Ht 69.0 in | Wt 348.8 lb

## 2022-03-14 DIAGNOSIS — I493 Ventricular premature depolarization: Secondary | ICD-10-CM

## 2022-03-14 DIAGNOSIS — Z79899 Other long term (current) drug therapy: Secondary | ICD-10-CM | POA: Diagnosis not present

## 2022-03-14 DIAGNOSIS — I509 Heart failure, unspecified: Secondary | ICD-10-CM | POA: Diagnosis not present

## 2022-03-14 DIAGNOSIS — I5042 Chronic combined systolic (congestive) and diastolic (congestive) heart failure: Secondary | ICD-10-CM

## 2022-03-14 DIAGNOSIS — Z01812 Encounter for preprocedural laboratory examination: Secondary | ICD-10-CM

## 2022-03-14 DIAGNOSIS — I447 Left bundle-branch block, unspecified: Secondary | ICD-10-CM | POA: Insufficient documentation

## 2022-03-14 DIAGNOSIS — E785 Hyperlipidemia, unspecified: Secondary | ICD-10-CM | POA: Insufficient documentation

## 2022-03-14 DIAGNOSIS — Z7984 Long term (current) use of oral hypoglycemic drugs: Secondary | ICD-10-CM | POA: Insufficient documentation

## 2022-03-14 DIAGNOSIS — I428 Other cardiomyopathies: Secondary | ICD-10-CM | POA: Insufficient documentation

## 2022-03-14 DIAGNOSIS — I5022 Chronic systolic (congestive) heart failure: Secondary | ICD-10-CM | POA: Diagnosis not present

## 2022-03-14 DIAGNOSIS — G4733 Obstructive sleep apnea (adult) (pediatric): Secondary | ICD-10-CM | POA: Insufficient documentation

## 2022-03-14 DIAGNOSIS — Z6841 Body Mass Index (BMI) 40.0 and over, adult: Secondary | ICD-10-CM | POA: Diagnosis not present

## 2022-03-14 DIAGNOSIS — I110 Hypertensive heart disease with heart failure: Secondary | ICD-10-CM | POA: Insufficient documentation

## 2022-03-14 LAB — BASIC METABOLIC PANEL
Anion gap: 8 (ref 5–15)
BUN: 9 mg/dL (ref 6–20)
CO2: 28 mmol/L (ref 22–32)
Calcium: 9.3 mg/dL (ref 8.9–10.3)
Chloride: 104 mmol/L (ref 98–111)
Creatinine, Ser: 0.91 mg/dL (ref 0.61–1.24)
GFR, Estimated: >60 mL/min (ref >60)
Glucose, Bld: 128 mg/dL — ABNORMAL HIGH (ref 70–99)
Potassium: 3.5 mmol/L (ref 3.5–5.1)
Sodium: 140 mmol/L (ref 135–145)

## 2022-03-14 NOTE — Patient Instructions (Signed)
There has been no changes to your medications.  Labs done today, your results will be available in MyChart, we will contact you for abnormal readings.  You have been referred to the healthy weight & wellness clinic. They will call you to arrange your appointment.  Your physician recommends that you schedule a follow-up appointment in: 2 months.  If you have any questions or concerns before your next appointment please send [place] a message through [place] or call our office at [phone].    TO LEAVE A MESSAGE FOR THE NURSE SELECT OPTION 2, PLEASE LEAVE A MESSAGE INCLUDING: YOUR NAME DATE OF BIRTH CALL BACK NUMBER REASON FOR CALL**this is important as we prioritize the call backs  YOU WILL RECEIVE A CALL BACK THE SAME DAY AS LONG AS YOU CALL BEFORE 4:00 PM  At the [facility], you and your health needs are our priority. As part of our continuing mission to provide you with exceptional heart care, we have created designated Provider Care Teams. These Care Teams include your primary Cardiologist (physician) and Advanced Practice Providers (APPs- Physician Assistants and Nurse Practitioners) who all work together to provide you with the care you need, when you need it.   You may see any of the following providers on your designated Care Team at your next follow up: Dr Glori Bickers Dr Loralie Champagne Dr. Roxana Hires, NP Lyda Jester, [place] [facility], [place] Forestine Na, NP Audry Riles, PharmD   Please be sure to bring in all your medications bottles to every appointment.

## 2022-03-14 NOTE — Progress Notes (Signed)
PCP: [facility] Cardiology: Dr. Ellyn Hack HF Cardiology: Dr. Aundra Dubin  41 y.o. with history of OSA, HTN, and chronic systolic CHF was referred by Dr. Ellyn Hack for evaluation of CHF.  Patient was found to have a cardiomyopathy in 2022. He reports a COVID-19 infection in 12/21, shortly before CHF symptoms developed.   In 3/22, echo showed EF 30%.  Left/right heart cath in 3/22 showed mild nonobstructive CAD, CI 2.6.  Repeat echo in 5/23 showed EF < 20%.  Cardiac MRI in 7/23 showed EF 26%, septal-lateral dyssynchrony, RVEF 38%, nonspecific RV insertion site LGE.  Patient rarely drinks ETOH and does not use drugs.  His father was noted to have CHF around age 19.  [employer] gene testing was negative for common mutations causing familial cardiomyopathy.   He is currently doing well.  He works as a Agricultural engineer at [employer].  He has long days and he is very tired by the end of the day.  He walks a lot at school, no dyspnea on flat ground.  He has been using his CPAP.  No orthopnea/PND. Weight up 4 lbs.    ECG (personally reviewed): NSR, LBBB 192 msec  Labs (4/22): K 4.1, creatinine 1.01 Labs (8/23): K 4.3, creatinine 0.99, LDL 41, BNP 35  PMH: 1. Obesity 2. OSA: Uses CPAP 3. HTN 4. H/o nephrolithiasis 5. H/o COVID-19 in 12/21 6. Chronic systolic CHF: Nonischemic cardiomyopathy.   - Echo (3/22): EF 30% - LHC/RHC (3/22) with nonobstructive CAD, CI 2.6.  - Zio monitor (5/23): 6% PVCs - Echo (5/23): EF < 20% - Cardiac MRI (7/23): EF 26%, septal-lateral dyssynchrony, RVEF 38%, nonspecific RV insertion site LGE.  - [employer] gene testing for familial cardiomyopathies negative  SH: Works as Agricultural engineer at [employer].  No ETOH, no drugs, no smoking.   FH: Father with CHF at 51.   ROS: All systems reviewed and negative except as per HPI.   Current Outpatient Medications  Medication Sig Dispense Refill   albuterol (VENTOLIN HFA) 108 (90 Base) MCG/ACT  inhaler Inhale 2 puffs into the lungs every 6 (six) hours as needed for wheezing or shortness of breath. 8 g 2   aspirin (ASPIRIN ADULT LOW STRENGTH) 81 MG EC tablet Take 1 tablet (81 mg total) by mouth daily. Take 1 tablet (81mg ) ONCE daily. 90 tablet 3   atorvastatin (LIPITOR) 40 MG tablet TAKE 1 TABLET BY MOUTH DAILY. 90 tablet 2   carvedilol (COREG) 12.5 MG tablet Take 1 tablet (12.5 mg total) by mouth 2 (two) times daily with a meal. 180 tablet 3   dapagliflozin propanediol (FARXIGA) 10 MG TABS tablet Take 1 tablet (10 mg total) by mouth daily. 90 tablet 0   furosemide (LASIX) 40 MG tablet TAKE 1 TABLET (40 MG TOTAL) BY MOUTH DAILY. 90 tablet 2   sacubitril-valsartan (ENTRESTO) 49-51 MG TAKE 1 TABLET BY MOUTH 2 TIMES DAILY. 60 tablet 11   spironolactone (ALDACTONE) 25 MG tablet Take 1 tablet (25 mg total) by mouth daily. 90 tablet 0   No current facility-administered medications for this encounter.   BP 98/60   Pulse 88   Wt (!) 157 kg (346 lb 3.2 oz)   SpO2 96%   BMI 51.12 kg/m  General: NAD, obese Neck: No JVD, no thyromegaly or thyroid nodule.  Lungs: Clear to auscultation bilaterally with normal respiratory effort. CV: Nondisplaced PMI.  Heart regular S1/S2, no S3/S4, no murmur.  No peripheral edema.  No carotid bruit.  Normal pedal pulses.  Abdomen: Soft, nontender, no hepatosplenomegaly, no distention.  Skin: Intact without lesions or rashes.  Neurologic: Alert and oriented x 3.  Psych: Normal affect. Extremities: No clubbing or cyanosis.  HEENT: Normal.   Assessment/Plan: 1. Chronic systolic CHF: Nonischemic cardiomyopathy.  He reports a COVID-19 infection in 12/21, shortly before CHF symptoms developed.   In 3/22, echo showed EF 30%.  Left/right heart cath in 3/22 showed mild nonobstructive CAD, CI 2.6.  Repeat echo in 5/23 showed EF < 20%.  Cardiac MRI in 7/23 showed EF 26%, septal-lateral dyssynchrony, RVEF 38%, nonspecific RV insertion site LGE.  Rare ETOH, no drugs.   Father with CHF diagnosed at 59.  [employer] gene testing negative for common genes associated with familial cardiomyopathy.  cMRI did not show LGE in a myocarditis-type pattern, but cannot fully rule this out.  I do not think that his HTN was ever bad enough to have caused LV systolic function to drop.  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  He is not volume overloaded on exam, NYHA class II, limited mainly by fatigue.  - Continue Lasix 40 mg daily, BMET today.  - Continue Coreg 12.5 mg bid, no BP room to increase today.   - Continue Entresto 49/51 bid, no BP room to increase today.  - Continue spironolactone 25 daily.  - Continue Farxiga 10 mg daily.  - He will be seeing EP soon; he has a wide LBBB and would likely benefit from CRT with a left bundle lead or a CS lead (192 msec QRS).  2. OSA: Using CPAP.  3. Obesity: I recommended semaglutide trial, he is not interested in this.  - [facility] will [facility] to [facility].   Followup with APP in 2 months.    Loralie Champagne. 03/14/2022

## 2022-03-14 NOTE — Progress Notes (Signed)
Electrophysiology Office Note   Date:  03/14/2022   ID:  Alex Allen, DOB 03/13/1981, MRN [id number]  PCP:  [facility]  Cardiologist:  Ellyn Hack Primary Electrophysiologist:  Clent Damore Meredith Leeds, MD    Chief Complaint: CHF   History of Present Illness: Alex Allen is a 41 y.o. male who is being seen today for the evaluation of CHF at the request of [facility] Presenting today for electrophysiology evaluation.  He has a history of several chronic systolic heart failure due to nonischemic cardiomyopathy, morbid obesity, obstructive sleep apnea, hypertension.  He had a transthoracic echo that showed an ejection fraction of less than 20%.  He has left bundle branch block.  Left heart catheterization showed no obstructive coronary artery disease.  He had a cardiac MRI that showed a persistently low ejection fraction.  Today, denies symptoms of palpitations, chest pain, shortness of breath, orthopnea, PND, lower extremity edema, claudication, dizziness, presyncope, syncope, bleeding, or neurologic sequela. The patient is tolerating medications without difficulties.  He continues to work and exercise without issue.  He is able to do all of his daily activities.  He does state that at the end of the day he is quite fatigued and has trouble staying awake when he gets home.  He is ready for his defibrillator implant.    Past Medical History:  Diagnosis Date   Chronic combined systolic and diastolic CHF (congestive heart failure) (HCC)    LVEF of 30% on Echo in 08/2020   Coronary artery disease, non-occlusive    non-obstructive CAD on LHC in 08/2020   Hypertension    LBBB (left bundle branch block)    Morbid obesity ([place])    Nephrolithiasis 11/2020   Repeat visit in February 2023   Non-ischemic cardiomyopathy ([place]) 08/2020   Noted in the setting of hypertensive emergency.  EF 30% by echo.  Moderate severely reduced function.  Minimal CAD on  [facility] with relatively normal RHC pressures.  Follow-up Echo May 2023-EF <20%.   Obstructive sleep apnea    Past Surgical History:  Procedure Laterality Date   RIGHT/LEFT HEART CATH AND CORONARY ANGIOGRAPHY N/A 09/21/2020   Procedure: RIGHT/LEFT HEART CATH AND CORONARY ANGIOGRAPHY;  Surgeon: Troy Sine, MD;  Location: [facility]:: Proximal LAD 10%, mid LAD 20%.  D1 50%.  Proximal LCx 30%, mid RCA 20% and RPDA 30%.  Very large caliber coronary arteries.  Minimally elevated right heart Pressures.  Fick CO-CI 7.1-2.6   TRANSTHORACIC ECHOCARDIOGRAM  09/20/2020   EF~30%.  Moderate-severely reduced LV function.  Baseline admitted inferior and inferoseptal akinesis with severe anteroseptal hypokinesis.  Moderate LVH.  Indeterminate diastolic function with mild LA dilation.  Unable to assess PAP dilated IVC suggesting RAP 15 mmHg.   TRANSTHORACIC ECHOCARDIOGRAM  11/10/2021   EF <20% with severely reduced function.  Dilated LV.  Entire inferior wall, mid inferoseptal basilar septal wall are akinetic anterior and lateral wall are hypokinetic.   ZIO PATCH MONITOR  09/2021   Sinus rhythm rates 61 129 bpm, average 84 bpm.  No PACs noted.  Occasional isolated PVCs (4.9%) and occasional (1.1%) couplets.  No bigeminy or trigeminy.     Current Outpatient Medications  Medication Sig Dispense Refill   albuterol (VENTOLIN HFA) 108 (90 Base) MCG/ACT inhaler Inhale 2 puffs into the lungs every 6 (six) hours as needed for wheezing or shortness of breath. 8 g 2   aspirin (ASPIRIN ADULT LOW STRENGTH) 81 MG EC  tablet Take 1 tablet (81 mg total) by mouth daily. Take 1 tablet (81mg ) ONCE daily. 90 tablet 3   atorvastatin (LIPITOR) 40 MG tablet TAKE 1 TABLET BY MOUTH DAILY. 90 tablet 2   carvedilol (COREG) 12.5 MG tablet Take 1 tablet (12.5 mg total) by mouth 2 (two) times daily with a meal. 180 tablet 3   dapagliflozin propanediol (FARXIGA) 10 MG TABS tablet Take 1 tablet (10 mg total) by mouth daily. 90 tablet  0   furosemide (LASIX) 40 MG tablet TAKE 1 TABLET (40 MG TOTAL) BY MOUTH DAILY. 90 tablet 2   sacubitril-valsartan (ENTRESTO) 49-51 MG TAKE 1 TABLET BY MOUTH 2 TIMES DAILY. 60 tablet 11   spironolactone (ALDACTONE) 25 MG tablet Take 1 tablet (25 mg total) by mouth daily. 90 tablet 0   No current facility-administered medications for this visit.    Allergies:   Patient has no known allergies.   Social History:  The patient  reports that he quit smoking about 18 months ago. His smoking use included cigars. He has never used smokeless tobacco. He reports current alcohol use. He reports that he does not use drugs.   Family History:  The patient's family history includes Diabetes in his father; Heart failure in his father; Hyperlipidemia in his father; Hypertension in his father; Thyroid disease in his mother.   ROS:  Please see the history of present illness.   Otherwise, review of systems is positive for none.   All other systems are reviewed and negative.   PHYSICAL EXAM: VS:  BP 100/74   Pulse 86   Ht 5\' 9"  (1.753 m)   Wt (!) 348 lb 12.8 oz (158.2 kg)   SpO2 97%   BMI 51.51 kg/m  , BMI Body mass index is 51.51 kg/m. GEN: Well nourished, well developed, in no acute distress  HEENT: normal  Neck: no JVD, carotid bruits, or masses Cardiac: RRR; no murmurs, rubs, or gallops,no edema  Respiratory:  clear to auscultation bilaterally, normal work of breathing GI: soft, nontender, nondistended, + BS MS: no deformity or atrophy  Skin: warm and dry Neuro:  Strength and sensation are intact Psych: euthymic mood, full affect  EKG:  EKG is not ordered today. Personal review of the ekg ordered 03/14/22 shows [place] rhythm, left bundle branch block  Recent Labs: 01/27/2022: B Natriuretic Peptide 34.8 03/14/2022: BUN 9; Creatinine, Ser 0.91; Potassium 3.5; Sodium 140    Lipid Panel     Component Value Date/Time   CHOL 87 01/27/2022 1136   TRIG 86 01/27/2022 1136   HDL 29 (L) 01/27/2022  1136   CHOLHDL 3.0 01/27/2022 1136   VLDL 17 01/27/2022 1136   LDLCALC 41 01/27/2022 1136     Wt Readings from Last 3 Encounters:  03/14/22 (!) 348 lb 12.8 oz (158.2 kg)  03/14/22 (!) 346 lb 3.2 oz (157 kg)  01/27/22 (!) 342 lb 12.8 oz (155.5 kg)      Other studies Reviewed: Additional studies/ records that were reviewed today include: CMRI 01/24/2022 Review of the above records today demonstrates:  1. Severe LV dilation with EF 26%. Global hypokinesis with septal-lateral dyssynchrony consistent with LBBB.   2. Normal RV size with moderately decreased systolic function, EF [id number].   3. Nonspecific RV insertion site LGE. This can be seen with pressure/volume overload.   4.  Normal extracellular volume percentage.   ASSESSMENT AND PLAN:  1.  Chronic systolic heart failure due to nonischemic cardiomyopathy: Currently on optimal medical therapy including  carvedilol 9.375 mg twice daily, Farxiga 10 mg daily, Entresto 49/51 mg twice daily, Aldactone 25 mg daily.  Ejection fraction remains decreased.  He has left bundle branch block and would thus benefit from CRT-D implant.  Risk and benefits were discussed risk of bleeding, tamponade, infection, pneumothorax, lead dislodgment.  He understands these risks and is agreed to the procedure.  2.  Hyperlipidemia: Continue atorvastatin 80 mg daily per primary cardiology.  3.  PVCs: 6% burden on cardiac monitor.  Continue to monitor.   Current medicines are reviewed at length with the patient today.   The patient does not have concerns regarding his medicines.  The following changes were made today:  none  Labs/ tests ordered today include:  Orders Placed This Encounter  Procedures   Basic metabolic panel   CBC     Disposition:   FU with Shantice Menger 3 months  Signed, Emmett Bracknell Meredith Leeds, MD  03/14/2022 5:12 PM     [address] [place] [address] [phone] (office) [phone]  (fax)

## 2022-03-14 NOTE — Patient Instructions (Signed)
Medication Instructions:  Your physician recommends that you continue on your current medications as directed. Please refer to the Current Medication list given to you today.  *If you need a refill on your cardiac medications before your next appointment, please call your pharmacy*   Lab Work: Pre procedure blood work: BMET & CBC on 05/15/22.  You do NOT need to be fasting. You can stop by anytime that day between 7:30 am - 4:30 pm  If you have labs (blood work) drawn today and your tests are completely normal, you will receive your results only by: [facility] (if you have MyChart) OR A paper copy in the mail If you have any lab test that is abnormal or we need to change your treatment, we will call you to review the results.   Testing/Procedures: Your physician has recommended that you have a defibrillator inserted. An implantable cardioverter defibrillator (ICD) is a small device that is placed in your chest or, in rare cases, your abdomen. This device uses electrical pulses or shocks to help control life-threatening, irregular heartbeats that could lead the heart to suddenly stop beating (sudden cardiac arrest). Leads are attached to the ICD that goes into your heart. This is done in the hospital and usually requires an overnight stay. Please see the instruction sheet given to you today for more information.    Follow-Up: At [facility], you and your health needs are our priority.  As part of our continuing mission to provide you with exceptional heart care, we have created designated Provider Care Teams.  These Care Teams include your primary Cardiologist (physician) and Advanced Practice Providers (APPs -  Physician Assistants and Nurse Practitioners) who all work together to provide you with the care you need, when you need it.  Your next appointment:   2 week(s) after your CRT implant  The format for your next appointment:   In Person  Provider:   Device clinic for  a wound check  Other Instructions  Important Information About Sugar       Cardioverter Defibrillator Implantation An implantable cardioverter defibrillator (ICD) is a small, lightweight, battery-powered device that is placed (implanted) under the skin in the chest or abdomen. Your caregiver may prescribe an ICD if: You have had an irregular heart rhythm (arrhythmia) that originated in the lower chambers of the heart (ventricles). Your heart has been damaged by a disease (such as coronary artery disease) or heart condition (such as a heart attack). An ICD consists of a battery that lasts several years, a small computer called a pulse generator, and wires called leads that go into the heart. It is used to detect and correct two dangerous arrhythmias: a rapid heart rhythm (tachycardia) and an arrhythmia in which the ventricles contract in an uncoordinated way (fibrillation). When an ICD detects tachycardia, it sends an electrical signal to the heart that restores the heartbeat to normal (cardioversion). This signal is usually painless. If cardioversion does not work or if the ICD detects fibrillation, it delivers a small electrical shock to the heart (defibrillation) to restart the heart. The shock may feel like a strong jolt in the chest. ICDs may be programmed to correct other problems. Sometimes, ICDs are programmed to act as another type of implantable device called a pacemaker. Pacemakers are used to treat a slow heartbeat (bradycardia). LET YOUR CAREGIVER KNOW ABOUT: Any allergies you have. All medicines you are taking, including vitamins, herbs, eyedrops, and over-the-counter medicines and creams. Previous problems you or members  of your family have had with the use of anesthetics. Any blood disorders you have had. Other health problems you have. RISKS AND COMPLICATIONS Generally, the procedure to implant an ICD is safe. However, as with any surgical procedure, complications can occur.  Possible complications associated with implanting an ICD include: Swelling, bleeding, or bruising at the site where the ICD was implanted. Infection at the site where the ICD was implanted. A reaction to medicine used during the procedure. Nerve, heart, or blood vessel damage. Blood clots. BEFORE THE PROCEDURE You may need to have blood tests, heart tests, or a chest X-ray done before the day of the procedure. Ask your caregiver about changing or stopping your regular medicines. Make plans to have someone drive you home. You may need to stay in the hospital overnight after the procedure. Stop smoking at least 24 hours before the procedure. Take a bath or shower the night before the procedure. You may need to scrub your chest or abdomen with a special type of soap. Do not eat or drink before your procedure for as long as directed by your caregiver. Ask if it is okay to take any needed medicine with a small sip of water. PROCEDURE  The procedure to implant an ICD in your chest or abdomen is usually done at a hospital in a room that has a large X-ray machine called a fluoroscope. The machine will be above you during the procedure. It will help your caregiver see your heart during the procedure. Implanting an ICD usually takes 1-3 hours. Before the procedure:  Small monitors will be put on your body. They will be used to check your heart, blood pressure, and oxygen level. A needle will be put into a vein in your hand or arm. This is called an intravenous (IV) access tube. Fluids and medicine will flow directly into your body through the IV tube. Your chest or abdomen will be cleaned with a germ-killing (antiseptic) solution. The area may be shaved. You may be given medicine to help you relax (sedative). You will be given a medicine called a local anesthetic. This medicine will make the surgical site numb while the ICD is implanted. You will be sleepy but awake during the procedure. After you are numb  the procedure will begin. The caregiver will: Make a small cut (incision). This will make a pocket deep under your skin that will hold the pulse generator. Guide the leads through a large blood vessel into your heart and attach them to the heart muscles. Depending on the ICD, the leads may go into one ventricle or they may go to both ventricles and into an upper chamber of the heart (atrium). Test the ICD. Close the incision with stitches, glue, or staples. AFTER THE PROCEDURE You may feel pain. Some pain is normal. It may last a few days. You may stay in a recovery area until the local anesthetic has worn off. Your blood pressure and pulse will be checked often. You will be taken to a room where your heart will be monitored. A chest X-ray will be taken. This is done to check that the cardioverter defibrillator is in the right place. You may stay in the hospital overnight. A slight bump may be seen over the skin where the ICD was placed. Sometimes, it is possible to feel the ICD under the skin. This is normal. In the months and years afterward, your caregiver will check the device, the leads, and the battery every few months. Eventually,  when the battery is low, the ICD will be replaced.   This information is not intended to replace advice given to you by your health care provider. Make sure you discuss any questions you have with your health care provider.   Document Released: 03/04/2002 Document Revised: 04/02/2013 Document Reviewed: 07/01/2012 Elsevier Interactive Patient Education 2016 [employer] Defibrillator Implantation, Care After This sheet gives you information about how to care for yourself after your procedure. Your health care provider may also give you more specific instructions. If you have problems or questions, contact your health care provider. What can I expect after the procedure? After the procedure, it is common to have: Some pain. It may last a few  days. A slight bump over the skin where the device was placed. Sometimes, it is possible to feel the device under the skin. This is normal.  During the months and years after your procedure, your health care provider will check the device, the leads, and the battery every few months. Eventually, when the battery is low, the device will be replaced. Follow these instructions at home: Medicines Take over-the-counter and prescription medicines only as told by your health care provider. If you were prescribed an antibiotic medicine, take it as told by your health care provider. Do not stop taking the antibiotic even if you start to feel better. Incision care  Follow instructions from your health care provider about how to take care of your incision area. Make sure you: Wash your hands with soap and water before you change your bandage (dressing). If soap and water are not available, use hand sanitizer. Change your dressing as told by your health care provider. Leave stitches (sutures), skin glue, or adhesive strips in place. These skin closures may need to stay in place for 2 weeks or longer. If adhesive strip edges start to loosen and curl up, you may trim the loose edges. Do not remove adhesive strips completely unless your health care provider tells you to do that. Check your incision area every day for signs of infection. Check for: More redness, swelling, or pain. More fluid or blood. Warmth. Pus or a bad smell. Do not use lotions or ointments near the incision area unless told by your health care provider. Keep the incision area clean and dry for 2-3 days after the procedure or for as long as told by your health care provider. It takes several weeks for the incision site to heal completely. Do not take baths, swim, or use a hot tub until your health care provider approves. Activity Try to walk a little every day. Exercising is important after this procedure. Also, use your shoulder on the  side of the defibrillator in daily tasks that do not require a lot of motion. For at least 6 weeks: Do not lift your upper arm above your shoulders. This means no tennis, golf, or swimming for this period of time. If you tend to sleep with your arm above your head, use a restraint to prevent this during sleep. Avoid sudden jerking, pulling, or chopping movements that pull your upper arm far away from your body. Ask your health care provider when you may go back to work. Check with your health care provider before you start to drive or play sports. Electric and magnetic fields Tell all health care providers that you have a defibrillator. This may prevent them from giving you an MRI scan because strong magnets are used for that test. If you  must pass through a metal detector, quickly walk through it. Do not stop under the detector, and do not stand near it. Avoid places or objects that have a strong electric or magnetic field, including: Engineer, maintenance. At the airport, let officials know that you have a defibrillator. Your defibrillator ID card will let you be checked in a way that is safe for you and will not damage your defibrillator. Also, do not let a security person wave a magnetic wand near your defibrillator. That can make it stop working. Power plants. Large electrical generators. Anti-theft systems or electronic article surveillance (EAS). Radiofrequency transmission towers, such as cell phone and radio towers. Do not use amateur (ham) radio equipment or electric (arc) welding torches. Some devices are safe to use if held at least 12 inches (30 cm) from your defibrillator. These include power tools, lawn mowers, and speakers. If you are unsure if something is safe to use, ask your health care provider. Do not use MP3 player headphones. They have magnets. You may safely use electric blankets, heating pads, computers, and microwave ovens. When using your cell phone, hold it to the ear  that is on the opposite side from the defibrillator. Do not leave your cell phone in a pocket over the defibrillator. General instructions Follow diet instructions from your health care provider, if this applies. Always keep your defibrillator ID card with you. The card should list the implant date, device model, and manufacturer. Consider wearing a medical alert bracelet or necklace. Have your defibrillator checked every 3-6 months or as often as told by your health care provider. Most defibrillators last for 4-8 years. Keep all follow-up visits as told by your health care provider. This is important for your health care provider to make sure your chest is healing the way it should. Ask your health care provider when you should come back to have your stitches or staples taken out. Contact a health care provider if: You feel one shock in your chest. You gain weight suddenly. Your legs or feet swell more than they have before. It feels like your heart is fluttering or skipping beats (heart palpitations). You have more redness, swelling, or pain around your incision. You have more fluid or blood coming from your incision. Your incision feels warm to the touch. You have pus or a bad smell coming from your incision. You have a fever. Get help right away if: You have chest pain. You feel more than one shock. You feel more short of breath than you have felt before. You feel more light-headed than you have felt before. Your incision starts to open up. This information is not intended to replace advice given to you by your health care provider. Make sure you discuss any questions you have with your health care provider. Document Released: 12/30/2004 Document Revised: 12/31/2015 Document Reviewed: 11/17/2015 Elsevier Interactive Patient Education  2018 [employer] Discharge Instructions for  Pacemaker/Defibrillator Patients  ACTIVITY No heavy lifting or vigorous activity  with your left/right arm for 6 to 8 weeks.  Do not raise your left/right arm above your head for one week.  Gradually raise your affected arm as drawn below.           __  NO DRIVING for     ; you may begin driving on     .  WOUND CARE Keep the wound area clean and dry.  Do not get this area wet for one week. No  showers for one week; you may shower on     . The tape/steri-strips on your wound will fall off; do not pull them off.  No bandage is needed on the site.  DO  NOT apply any creams, oils, or ointments to the wound area. If you notice any drainage or discharge from the wound, any swelling or bruising at the site, or you develop a fever > 101? F after you are discharged home, call the office at once.  SPECIAL INSTRUCTIONS You are still able to use cellular telephones; use the ear opposite the side where you have your pacemaker/defibrillator.  Avoid carrying your cellular phone near your device. When traveling through airports, show security personnel your identification card to avoid being screened in the metal detectors.  Ask the security personnel to use the hand wand. Avoid arc welding equipment, MRI testing (magnetic resonance imaging), TENS units (transcutaneous nerve stimulators).  Call the office for questions about other devices. Avoid electrical appliances that are in poor condition or are not properly grounded. Microwave ovens are safe to be near or to operate.  ADDITIONAL INFORMATION FOR DEFIBRILLATOR PATIENTS SHOULD YOUR DEVICE GO OFF: If your device goes off ONCE and you feel fine afterward, notify the device clinic nurses. If your device goes off ONCE and you do not feel well afterward, call 911. If your device goes off TWICE, call 911. If your device goes off THREE TIMES IN ONE DAY, call 911.  DO NOT DRIVE YOURSELF OR A FAMILY MEMBER WITH A DEFIBRILLATOR TO THE HOSPITAL--CALL 911.

## 2022-03-18 ENCOUNTER — Other Ambulatory Visit: Payer: Self-pay | Admitting: Cardiology

## 2022-04-25 ENCOUNTER — Encounter (INDEPENDENT_AMBULATORY_CARE_PROVIDER_SITE_OTHER): Payer: Self-pay

## 2022-05-08 ENCOUNTER — Telehealth: Payer: Self-pay | Admitting: Cardiology

## 2022-05-08 NOTE — Telephone Encounter (Signed)
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

## 2022-05-08 NOTE — Telephone Encounter (Signed)
  Pt is requesting to get a letter for his FMLA to be off work from date of procedure 05/19/22 to 06/30/21. He said, he have enough PTO and need time to heal.

## 2022-05-08 NOTE — Telephone Encounter (Signed)
Pt needs a note stating he is having a procedure and he needs to be out of work for 6 weeks afterwards. His concern is trying to go back to work and being sluggish and sleepy after his last procedure (cath) left him this way. 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 Aware that I will discuss with Dr. Elberta Fortis if 6 week absence is agreeable. Pt aware will let him know later this week MD recommendation. Patient verbalized understanding and agreeable to plan.

## 2022-05-08 NOTE — Telephone Encounter (Signed)
Left message to call back  

## 2022-05-09 ENCOUNTER — Encounter: Payer: Self-pay | Admitting: Cardiology

## 2022-05-09 NOTE — Telephone Encounter (Signed)
Discussed that request for 6 wk post implant time would be discussed w/ MD. He wanted to let us know that would be during christmas break. Aware will let him know by the end of this week. Patient verbalized understanding and agreeable to plan.

## 2022-05-09 NOTE — Telephone Encounter (Signed)
Error

## 2022-05-09 NOTE — Telephone Encounter (Signed)
Pt calling asking to speak with Sherri again about his work note

## 2022-05-12 NOTE — Telephone Encounter (Signed)
Per Dr. Elberta Fortis:  Informed that we will evaluate need for extended leave at wound check on 12/6, post CRTD. Patient verbalized understanding and agreeable to plan.

## 2022-05-12 NOTE — Telephone Encounter (Signed)
Pt is calling to speak to [place], Charity fundraiser, again in regards to paperwork for work. Requesting call back. He gets a break from 2:30 P.M. to 3:20 P.M. and also available after 4:30 P.M.

## 2022-05-15 ENCOUNTER — Encounter (HOSPITAL_COMMUNITY): Payer: BC Managed Care – PPO

## 2022-05-15 ENCOUNTER — Ambulatory Visit: Payer: BC Managed Care – PPO | Attending: Cardiology

## 2022-05-15 DIAGNOSIS — Z01812 Encounter for preprocedural laboratory examination: Secondary | ICD-10-CM

## 2022-05-15 DIAGNOSIS — I5022 Chronic systolic (congestive) heart failure: Secondary | ICD-10-CM

## 2022-05-15 LAB — BASIC METABOLIC PANEL
BUN/Creatinine Ratio: 14 (ref 9–20)
BUN: 13 mg/dL (ref 6–24)
CO2: 27 mmol/L (ref 20–29)
Calcium: 9.7 mg/dL (ref 8.7–10.2)
Chloride: 99 mmol/L (ref 96–106)
Creatinine, Ser: 0.9 mg/dL (ref 0.76–1.27)
Glucose: 159 mg/dL — ABNORMAL HIGH (ref 70–99)
Potassium: 4.2 mmol/L (ref 3.5–5.2)
Sodium: 138 mmol/L (ref 134–144)
eGFR: 109 mL/min/{1.73_m2} (ref >59)

## 2022-05-15 LAB — CBC
Hematocrit: 47.2 % (ref 37.5–51.0)
Hemoglobin: 16.2 g/dL (ref 13.0–17.7)
MCH: 28.9 pg (ref 26.6–33.0)
MCHC: 34.3 g/dL (ref 31.5–35.7)
MCV: 84 fL (ref 79–97)
Platelets: 203 10*3/uL (ref 150–450)
RBC: 5.6 x10E6/uL (ref 4.14–5.80)
RDW: 14.1 % (ref 11.6–15.4)
WBC: 9.3 10*3/uL (ref 3.4–10.8)

## 2022-05-15 NOTE — Progress Notes (Incomplete)
PCP: [facility] [place] Cardiology: Dr. Ellyn Hack HF Cardiology: Dr. Aundra Dubin  41 y.o. with history of OSA, HTN, and chronic systolic CHF was referred by Dr. Ellyn Hack for evaluation of CHF.  Patient was found to have a cardiomyopathy in 2022. He reports a COVID-19 infection in 12/21, shortly before CHF symptoms developed.   In 3/22, echo showed EF 30%.  Left/right heart cath in 3/22 showed mild nonobstructive CAD, CI 2.6.  Repeat echo in 5/23 showed EF < 20%.  Cardiac MRI in 7/23 showed EF 26%, septal-lateral dyssynchrony, RVEF 38%, nonspecific RV insertion site LGE.  Patient rarely drinks ETOH and does not use drugs.  His father was noted to have CHF around age 86.  [employer]ae gene testing was negative for common mutations causing familial cardiomyopathy.   He is currently doing well.  He works as a Agricultural engineer at [employer].  He has long days and he is very tired by the end of the day.  He walks a lot at school, no dyspnea on flat ground.  He has been using his CPAP.  No orthopnea/PND. Weight up 4 lbs.    ECG (personally reviewed): NSR, LBBB 192 msec  Labs (4/22): K 4.1, creatinine 1.01 Labs (8/23): K 4.3, creatinine 0.99, LDL 41, BNP 35  PMH: 1. Obesity 2. OSA: Uses CPAP 3. HTN 4. H/o nephrolithiasis 5. H/o COVID-19 in 12/21 6. Chronic systolic CHF: Nonischemic cardiomyopathy.   - Echo (3/22): EF 30% - LHC/RHC (3/22) with nonobstructive CAD, CI 2.6.  - Zio monitor (5/23): 6% PVCs - Echo (5/23): EF < 20% - Cardiac MRI (7/23): EF 26%, septal-lateral dyssynchrony, RVEF 38%, nonspecific RV insertion site LGE.  - Inv[employer]ae gene testing for familial cardiomyopathies negative  SH: Works as Agricultural engineer at [employer].  No ETOH, no drugs, no smoking.   FH: Father with CHF at 18.   ROS: All systems reviewed and negative except as per HPI.   Current Outpatient Medications  Medication Sig Dispense Refill   albuterol (VENTOLIN HFA) 108 (90 Base) MCG/ACT  inhaler Inhale 2 puffs into the lungs every 6 (six) hours as needed for wheezing or shortness of breath. 8 g 2   aspirin (ASPIRIN ADULT LOW STRENGTH) 81 MG EC tablet Take 1 tablet (81 mg total) by mouth daily. Take 1 tablet (81mg ) ONCE daily. 90 tablet 3   atorvastatin (LIPITOR) 40 MG tablet TAKE 1 TABLET BY MOUTH DAILY. 90 tablet 2   carvedilol (COREG) 12.5 MG tablet Take 1 tablet (12.5 mg total) by mouth 2 (two) times daily with a meal. 180 tablet 3   dapagliflozin propanediol (FARXIGA) 10 MG TABS tablet TAKE 1 TABLET (10 MG TOTAL) BY MOUTH DAILY. 90 tablet 3   furosemide (LASIX) 40 MG tablet TAKE 1 TABLET (40 MG TOTAL) BY MOUTH DAILY. 90 tablet 2   sacubitril-valsartan (ENTRESTO) 49-51 MG TAKE 1 TABLET BY MOUTH 2 TIMES DAILY. 60 tablet 11   spironolactone (ALDACTONE) 25 MG tablet TAKE 1 TABLET (25 MG TOTAL) BY MOUTH DAILY. 90 tablet 3   No current facility-administered medications for this visit.   There were no vitals taken for this visit. General: NAD, obese Neck: No JVD, no thyromegaly or thyroid nodule.  Lungs: Clear to auscultation bilaterally with normal respiratory effort. CV: Nondisplaced PMI.  Heart regular S1/S2, no S3/S4, no murmur.  No peripheral edema.  No carotid bruit.  Normal pedal pulses.  Abdomen: Soft, nontender, no hepatosplenomegaly, no distention.  Skin: Intact  without lesions or rashes.  Neurologic: Alert and oriented x 3.  Psych: Normal affect. Extremities: No clubbing or cyanosis.  HEENT: Normal.   Assessment/Plan: 1. Chronic systolic CHF: Nonischemic cardiomyopathy.  He reports a COVID-19 infection in 12/21, shortly before CHF symptoms developed.   In 3/22, echo showed EF 30%.  Left/right heart cath in 3/22 showed mild nonobstructive CAD, CI 2.6.  Repeat echo in 5/23 showed EF < 20%.  Cardiac MRI in 7/23 showed EF 26%, septal-lateral dyssynchrony, RVEF 38%, nonspecific RV insertion site LGE.  Rare ETOH, no drugs.  Father with CHF diagnosed at 44.  Invitae gene  testing negative for common genes associated with familial cardiomyopathy.  cMRI did not show LGE in a myocarditis-type pattern, but cannot fully rule this out.  I do not think that his HTN was ever bad enough to have caused LV systolic function to drop.  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  He is not volume overloaded on exam, NYHA class II, limited mainly by fatigue.  - Continue Lasix 40 mg daily, BMET today.  - Continue Coreg 12.5 mg bid, no BP room to increase today.   - Continue Entresto 49/51 bid, no BP room to increase today.  - Continue spironolactone 25 daily.  - Continue Farxiga 10 mg daily.  - He will be seeing EP soon; he has a wide LBBB and would likely benefit from CRT with a left bundle lead or a CS lead (192 msec QRS).  2. OSA: Using CPAP.  3. Obesity: I recommended semaglutide trial, he is not interested in this.  - [facility] to [facility].   Followup with APP in 2 months.    Alex Allen. 05/15/2022

## 2022-05-17 NOTE — Pre-Procedure Instructions (Signed)
Instructed patient on the following items: Arrival time 0800 Nothing to eat or drink after midnight No meds AM of procedure Responsible person to drive you home and stay with you for 24 hrs Wash with special soap night before and morning of procedure

## 2022-05-19 ENCOUNTER — Ambulatory Visit (HOSPITAL_COMMUNITY)
Admission: RE | Admit: 2022-05-19 | Discharge: 2022-05-19 | Disposition: A | Discharge status: Home / Self Care | Payer: BC Managed Care – PPO | Source: Ambulatory Visit | Attending: Cardiology | Admitting: Cardiology

## 2022-05-19 ENCOUNTER — Other Ambulatory Visit: Payer: Self-pay

## 2022-05-19 ENCOUNTER — Ambulatory Visit (HOSPITAL_COMMUNITY)
Admission: RE | Disposition: A | Discharge status: Home / Self Care | Payer: Self-pay | Source: Ambulatory Visit | Attending: Cardiology

## 2022-05-19 ENCOUNTER — Ambulatory Visit (HOSPITAL_COMMUNITY): Payer: BC Managed Care – PPO

## 2022-05-19 DIAGNOSIS — I447 Left bundle-branch block, unspecified: Secondary | ICD-10-CM | POA: Diagnosis not present

## 2022-05-19 DIAGNOSIS — I110 Hypertensive heart disease with heart failure: Secondary | ICD-10-CM | POA: Insufficient documentation

## 2022-05-19 DIAGNOSIS — G4733 Obstructive sleep apnea (adult) (pediatric): Secondary | ICD-10-CM | POA: Diagnosis not present

## 2022-05-19 DIAGNOSIS — I5042 Chronic combined systolic (congestive) and diastolic (congestive) heart failure: Secondary | ICD-10-CM | POA: Insufficient documentation

## 2022-05-19 DIAGNOSIS — Z6841 Body Mass Index (BMI) 40.0 and over, adult: Secondary | ICD-10-CM | POA: Insufficient documentation

## 2022-05-19 DIAGNOSIS — I428 Other cardiomyopathies: Secondary | ICD-10-CM | POA: Diagnosis present

## 2022-05-19 DIAGNOSIS — Z87891 Personal history of nicotine dependence: Secondary | ICD-10-CM | POA: Diagnosis not present

## 2022-05-19 HISTORY — PX: BIV ICD INSERTION CRT-D: EP1195

## 2022-05-19 SURGERY — BIV ICD INSERTION CRT-D
Anesthesia: LOCAL

## 2022-05-19 MED ORDER — SODIUM CHLORIDE 0.9 % IV SOLN
80.0000 mg | INTRAVENOUS | Status: AC
  Administered 2022-05-19: 80 mg

## 2022-05-19 MED ORDER — LIDOCAINE HCL (PF) 1 % IJ SOLN
INTRAMUSCULAR | Status: AC
  Filled 2022-05-19: qty 60

## 2022-05-19 MED ORDER — ONDANSETRON HCL 4 MG/2ML IJ SOLN: 4.0000 mg | Freq: Four times a day (QID) | INTRAMUSCULAR | Status: DC | PRN

## 2022-05-19 MED ORDER — MIDAZOLAM HCL 5 MG/5ML IJ SOLN
INTRAMUSCULAR | Status: AC
  Filled 2022-05-19: qty 5

## 2022-05-19 MED ORDER — CEFAZOLIN IN SODIUM CHLORIDE 3-0.9 GM/100ML-% IV SOLN
3.0000 g | INTRAVENOUS | Status: AC
  Administered 2022-05-19: 3 g via INTRAVENOUS

## 2022-05-19 MED ORDER — LIDOCAINE HCL (PF) 1 % IJ SOLN
INTRAMUSCULAR | Status: DC | PRN
  Administered 2022-05-19: 20 mL
  Administered 2022-05-19: 60 mL

## 2022-05-19 MED ORDER — SODIUM CHLORIDE 0.9 % IV SOLN
INTRAVENOUS | Status: AC
  Filled 2022-05-19: qty 2

## 2022-05-19 MED ORDER — ACETAMINOPHEN 325 MG PO TABS: 325.0000 mg | ORAL_TABLET | ORAL | Status: DC | PRN

## 2022-05-19 MED ORDER — LIDOCAINE HCL 1 % IJ SOLN
INTRAMUSCULAR | Status: AC
  Filled 2022-05-19: qty 20

## 2022-05-19 MED ORDER — HEPARIN (PORCINE) IN NACL 1000-0.9 UT/500ML-% IV SOLN
INTRAVENOUS | Status: AC
  Filled 2022-05-19: qty 500

## 2022-05-19 MED ORDER — SODIUM CHLORIDE 0.9 % IV SOLN: INTRAVENOUS | Status: DC

## 2022-05-19 MED ORDER — FENTANYL CITRATE (PF) 100 MCG/2ML IJ SOLN
INTRAMUSCULAR | Status: AC
  Filled 2022-05-19: qty 2

## 2022-05-19 MED ORDER — CHLORHEXIDINE GLUCONATE 4 % EX LIQD: 4.0000 | Freq: Once | CUTANEOUS | Status: DC

## 2022-05-19 MED ORDER — CEFAZOLIN SODIUM-DEXTROSE 2-4 GM/100ML-% IV SOLN
INTRAVENOUS | Status: AC
  Filled 2022-05-19: qty 100

## 2022-05-19 MED ORDER — FENTANYL CITRATE (PF) 100 MCG/2ML IJ SOLN
INTRAMUSCULAR | Status: DC | PRN
  Administered 2022-05-19: 25 ug via INTRAVENOUS
  Administered 2022-05-19: 12.5 ug via INTRAVENOUS
  Administered 2022-05-19: 25 ug via INTRAVENOUS

## 2022-05-19 MED ORDER — HEPARIN (PORCINE) IN NACL 1000-0.9 UT/500ML-% IV SOLN
INTRAVENOUS | Status: DC | PRN
  Administered 2022-05-19: 500 mL

## 2022-05-19 MED ORDER — CEFAZOLIN SODIUM-DEXTROSE 1-4 GM/50ML-% IV SOLN
1.0000 g | Freq: Four times a day (QID) | INTRAVENOUS | Status: DC
  Administered 2022-05-19: 1 g via INTRAVENOUS
  Filled 2022-05-19 (×2): qty 50

## 2022-05-19 MED ORDER — MIDAZOLAM HCL 5 MG/5ML IJ SOLN
INTRAMUSCULAR | Status: DC | PRN
  Administered 2022-05-19 (×3): 1 mg via INTRAVENOUS

## 2022-05-19 SURGICAL SUPPLY — 19 items
BALLN COR SINUS VENO 6FR 80 (BALLOONS) ×1
BALLOON COR SINUS VENO 6FR 80 (BALLOONS) IMPLANT
CABLE SURGICAL S-101-97-12 (CABLE) ×1 IMPLANT
CATH CPS DIRECT 135 DS2C020 (CATHETERS) IMPLANT
CPS IMPLANT KIT 410190 (MISCELLANEOUS) IMPLANT
ICD GALLANT HFCRTD CDHFA500Q (ICD Generator) IMPLANT
LEAD DURATA 7122Q-65CM (Lead) IMPLANT
LEAD QUARTET 1458QL-86 (Lead) IMPLANT
LEAD ULTIPACE 52 LPA1231/52 (Lead) IMPLANT
MAT PREVALON FULL STRYKER (MISCELLANEOUS) IMPLANT
PAD DEFIB RADIO PHYSIO CONN (PAD) ×1 IMPLANT
QUARTET 1458QL-86 (Lead) ×1 IMPLANT
SHEATH 7FR PRELUDE SNAP 13 (SHEATH) IMPLANT
SHEATH 9.5FR PRELUDE SNAP 13 (SHEATH) IMPLANT
SHEATH PROBE COVER 6X72 (BAG) IMPLANT
SLITTER AGILIS HISPRO (INSTRUMENTS) IMPLANT
TRAY PACEMAKER INSERTION (PACKS) ×1 IMPLANT
WIRE ACUITY WHISPER EDS 4648 (WIRE) IMPLANT
WIRE HI TORQ VERSACORE-J 145CM (WIRE) IMPLANT

## 2022-05-19 NOTE — Discharge Instructions (Signed)
     Supplemental Discharge Instructions for  Pacemaker/Defibrillator Patients  Tomorrow, 05/20/22, send in a device transmission  Activity No heavy lifting or vigorous activity with your left/right arm for 6 to 8 weeks.  Do not raise your left/right arm above your head for one week.  Gradually raise your affected arm as drawn below.             05/24/22                     05/25/22                  05/26/22                     05/27/22 __  NO DRIVING until cleared to at your wound check visit  WOUND CARE Keep the wound area clean and dry.  Do not get this area wet , no showers until cleared to at your wound check visit Tomorrow, 05/20/22, remove the arm sling Tomorrow, 05/20/22 remove the LARGE outer plastic bandage.  Underneath the plastic bandage there are steri strips (paper tapes), DO NOT remove these. The tape/steri-strips on your wound will fall off; do not pull them off.  No bandage is needed on the site.  DO  NOT apply any creams, oils, or ointments to the wound area. If you notice any drainage or discharge from the wound, any swelling or bruising at the site, or you develop a fever > 101? F after you are discharged home, call the office at once.  Special Instructions You are still able to use cellular telephones; use the ear opposite the side where you have your pacemaker/defibrillator.  Avoid carrying your cellular phone near your device. When traveling through airports, show security personnel your identification card to avoid being screened in the metal detectors.  Ask the security personnel to use the hand wand. Avoid arc welding equipment, MRI testing (magnetic resonance imaging), TENS units (transcutaneous nerve stimulators).  Call the office for questions about other devices. Avoid electrical appliances that are in poor condition or are not properly grounded. Microwave ovens are safe to be near or to operate.  Additional information for defibrillator patients should your  device go off: If your device goes off ONCE and you feel fine afterward, notify the device clinic nurses. If your device goes off ONCE and you do not feel well afterward, call 911. If your device goes off TWICE, call 911. If your device goes off THREE times in one day, call 911.  DO NOT DRIVE YOURSELF OR A FAMILY MEMBER WITH A DEFIBRILLATOR TO THE HOSPITAL--CALL 911.

## 2022-05-19 NOTE — H&P (Signed)
Electrophysiology Office Note   Date:  05/19/2022   ID:  Alex Allen, DOB 21-Jan-1981, MRN [id number]  PCP:  [facility] [place]  Cardiologist:  Ellyn Hack Primary Electrophysiologist:  Monette Omara Meredith Leeds, MD    Chief Complaint: CHF   History of Present Illness: Alex Allen is a 41 y.o. male who is being seen today for the evaluation of CHF at the request of Alex Allen found. Presenting today for electrophysiology evaluation.  He has a history of several chronic systolic heart failure due to nonischemic cardiomyopathy, morbid obesity, obstructive sleep apnea, hypertension.  He had a transthoracic echo that showed an ejection fraction of less than 20%.  He has left bundle branch block.  Left heart catheterization showed Alex obstructive coronary artery disease.  He had a cardiac MRI that showed a persistently low ejection fraction.  Today, denies symptoms of palpitations, chest pain, shortness of breath, orthopnea, PND, lower extremity edema, claudication, dizziness, presyncope, syncope, bleeding, or neurologic sequela. The patient is tolerating medications without difficulties. Plan for ICD implant today.     Past Medical History:  Diagnosis Date   Chronic combined systolic and diastolic CHF (congestive heart failure) (HCC)    LVEF of 30% on Echo in 08/2020   Coronary artery disease, non-occlusive    non-obstructive CAD on [facility] in 08/2020   Hypertension    LBBB (left bundle branch block)    Morbid obesity (Alex Allen)    Nephrolithiasis 11/2020   Repeat visit in February 2023   Non-ischemic cardiomyopathy (Alex Allen) 08/2020   Noted in the setting of hypertensive emergency.  EF 30% by echo.  Moderate severely reduced function.  Minimal CAD on [facility] with relatively normal RHC pressures.  Follow-up Echo May 2023-EF <20%.   Obstructive sleep apnea    Past Surgical History:  Procedure Laterality Date   RIGHT/LEFT HEART CATH AND CORONARY ANGIOGRAPHY N/A 09/21/2020    Procedure: RIGHT/LEFT HEART CATH AND CORONARY ANGIOGRAPHY;  Surgeon: Troy Sine, MD;  Location: [place] [facility]:: Proximal LAD 10%, mid LAD 20%.  D1 50%.  Proximal LCx 30%, mid RCA 20% and RPDA 30%.  Very large caliber coronary arteries.  Minimally elevated right heart Pressures.  Fick CO-CI 7.1-2.6   TRANSTHORACIC ECHOCARDIOGRAM  09/20/2020   EF~30%.  Moderate-severely reduced LV function.  Baseline admitted inferior and inferoseptal akinesis with severe anteroseptal hypokinesis.  Moderate LVH.  Indeterminate diastolic function with mild LA dilation.  Unable to assess PAP dilated IVC suggesting RAP 15 mmHg.   TRANSTHORACIC ECHOCARDIOGRAM  11/10/2021   EF <20% with severely reduced function.  Dilated LV.  Entire inferior wall, mid inferoseptal basilar septal wall are akinetic anterior and lateral wall are hypokinetic.   ZIO PATCH MONITOR  09/2021   Sinus rhythm rates 61 129 bpm, average 84 bpm.  Alex PACs noted.  Occasional isolated PVCs (4.9%) and occasional (1.1%) couplets.  Alex bigeminy or trigeminy.     Current Facility-Administered Medications  Medication Dose Route Frequency Allen Last Rate Last Admin   0.9 %  sodium chloride infusion   Intravenous Continuous Constance Haw, MD 50 mL/hr at 05/19/22 0838 New Bag at 05/19/22 0838   ceFAZolin (ANCEF) IVPB 3g/100 mL premix  3 g Intravenous On Call Tavish Gettis Hassell Done, MD       chlorhexidine (HIBICLENS) 4 % liquid 4 Application  4 Application Topical Once Felix Pratt Hassell Done, MD       gentamicin (GARAMYCIN) 80 mg in sodium chloride 0.9 % 500  mL irrigation  80 mg Irrigation On Call Constance Haw, MD        Allergies:   Patient has Alex known allergies.   Social History:  The patient  reports that he quit smoking about 20 months ago. His smoking use included cigars. He has never used smokeless tobacco. He reports current alcohol use. He reports that he does not use drugs.   Family History:  The patient's family history  includes Diabetes in his father; Heart failure in his father; Hyperlipidemia in his father; Hypertension in his father; Thyroid disease in his mother.   ROS:  Please see the history of present illness.   Otherwise, review of systems is positive for none.   All other systems are reviewed and negative.   PHYSICAL EXAM: VS:  BP (!) 134/91   Pulse 79   Temp (!) 97.4 F (36.3 C) (Temporal)   Resp 17   Ht 5\' 9"  (1.753 m)   Wt (!) 158.8 kg   SpO2 97%   BMI 51.69 kg/m  , BMI Body mass index is 51.69 kg/m. GEN: Well nourished, well developed, in Alex acute distress  HEENT: normal  Neck: Alex JVD, carotid bruits, or masses Cardiac: RRR; Alex murmurs, rubs, or gallops,Alex edema  Respiratory:  clear to auscultation bilaterally, normal work of breathing GI: soft, nontender, nondistended, + BS MS: Alex deformity or atrophy  Skin: warm and dry Neuro:  Strength and sensation are intact Psych: euthymic mood, full affect  Recent Labs: 01/27/2022: B Natriuretic Peptide 34.8 05/15/2022: BUN 13; Creatinine, Ser 0.90; Hemoglobin 16.2; Platelets 203; Potassium 4.2; Sodium 138    Lipid Panel     Component Value Date/Time   CHOL 87 01/27/2022 1136   TRIG 86 01/27/2022 1136   HDL 29 (L) 01/27/2022 1136   CHOLHDL 3.0 01/27/2022 1136   VLDL 17 01/27/2022 1136   LDLCALC 41 01/27/2022 1136     Wt Readings from Last 3 Encounters:  05/19/22 (!) 158.8 kg  03/14/22 (!) 157 kg  03/14/22 (!) 158.2 kg      Other studies Reviewed: Additional studies/ records that were reviewed today include: CMRI 01/24/2022 Review of the above records today demonstrates:  1. Severe LV dilation with EF 26%. Global hypokinesis with septal-lateral dyssynchrony consistent with LBBB.   2. Normal RV size with moderately decreased systolic function, EF A999333.   3. Nonspecific RV insertion site LGE. This can be seen with pressure/volume overload.   4.  Normal extracellular volume percentage.   ASSESSMENT AND PLAN:  1.   Chronic systolic heart failure due to nonischemic cardiomyopathy:  ICD Criteria  Current LVEF:33%. Within 12 months prior to implant: Yes   Heart failure history: Yes, Class II  Cardiomyopathy history: Yes, Non-Ischemic Cardiomyopathy.  Atrial Fibrillation/Atrial Flutter: Alex.  Ventricular tachycardia history: Alex.  Cardiac arrest history: Alex.  History of syndromes with risk of sudden death: Alex.  Previous ICD: Alex.  Current ICD indication: Primary  PPM indication: Alex.  Class I or II Bradycardia indication present: Alex  Beta Blocker therapy for 3 or more months: Yes, prescribed.   Ace Inhibitor/ARB therapy for 3 or more months: Yes, prescribed.    I have seen DAREUS SUNDET is a 41 y.o. malepre-procedural and has been referred by Aundra Dubin  for consideration of ICD implant for secondary  prevention of sudden death.  The patient's chart has been reviewed and they meet criteria for ICD implant.  I have had a thorough discussion with the patient reviewing  options.  The patient and their family (if available) have had opportunities to ask questions and have them answered. The patient and I have decided together through the [facility] Share Decision Support Tool to implant ICD at this time.  Risks, benefits, alternatives to ICD implantation were discussed in detail with the patient today. The patient  understands that the risks include but are not limited to bleeding, infection, pneumothorax, perforation, tamponade, vascular damage, renal failure, MI, stroke, death, inappropriate shocks, and lead dislodgement and wishes to proceed.

## 2022-05-19 NOTE — Progress Notes (Signed)
Camnitz, MD stated that it was ok for patient to be discharged based on CXR results and device check.

## 2022-05-22 ENCOUNTER — Telehealth: Payer: Self-pay

## 2022-05-22 ENCOUNTER — Encounter (HOSPITAL_COMMUNITY): Payer: Self-pay | Admitting: Cardiology

## 2022-05-22 MED FILL — Lidocaine HCl Local Inj 1%: INTRAMUSCULAR | Qty: 20 | Status: AC

## 2022-05-22 MED FILL — Lidocaine HCl Local Preservative Free (PF) Inj 1%: INTRAMUSCULAR | Qty: 60 | Status: AC

## 2022-05-22 NOTE — Telephone Encounter (Signed)
Outreach made to Pt.  He states he is doing well.  Has been a little moody lately, but that is improving. All questions answered.  Pt sent transmission.  Follow-up after same day discharge: Implant date: 05/19/2022 MD: Loman Brooklyn, MD Device: SJ BIV ICD Location: left chest   Wound check visit: 05/31/2022 at 4:00 pm 90 day MD follow-up: scheduled  Remote Transmission received:yes  Dressing/sling removed: yes

## 2022-05-22 NOTE — Telephone Encounter (Signed)
-----   Message from Sheilah Pigeon, [place] sent at 05/19/2022  2:21 PM EST ----- Same day d/c  Abbott BiVe ICD  WC  No a/c

## 2022-05-31 ENCOUNTER — Ambulatory Visit: Payer: BC Managed Care – PPO | Attending: Interventional Cardiology

## 2022-05-31 DIAGNOSIS — I447 Left bundle-branch block, unspecified: Secondary | ICD-10-CM | POA: Diagnosis not present

## 2022-05-31 DIAGNOSIS — I428 Other cardiomyopathies: Secondary | ICD-10-CM | POA: Diagnosis not present

## 2022-05-31 LAB — CUP PACEART INCLINIC DEVICE CHECK
Battery Remaining Longevity: 52 mo
Brady Statistic RA Percent Paced: 0.26 %
Brady Statistic RV Percent Paced: 99 %
Date Time Interrogation Session: 20231206165214
HighPow Impedance: 57.375
Implantable Lead Connection Status: 753985
Implantable Lead Connection Status: 753985
Implantable Lead Connection Status: 753985
Implantable Lead Implant Date: 20231124
Implantable Lead Implant Date: 20231124
Implantable Lead Implant Date: 20231124
Implantable Lead Location: 753858
Implantable Lead Location: 753859
Implantable Lead Location: 753860
Implantable Pulse Generator Implant Date: 20231124
Lead Channel Impedance Value: 412.5 Ohm
Lead Channel Impedance Value: 487.5 Ohm
Lead Channel Impedance Value: 512.5 Ohm
Lead Channel Pacing Threshold Amplitude: 0.75 V
Lead Channel Pacing Threshold Amplitude: 0.75 V
Lead Channel Pacing Threshold Amplitude: 1 V
Lead Channel Pacing Threshold Amplitude: 1 V
Lead Channel Pacing Threshold Amplitude: 2.25 V
Lead Channel Pacing Threshold Amplitude: 2.25 V
Lead Channel Pacing Threshold Pulse Width: 0.5 ms
Lead Channel Pacing Threshold Pulse Width: 0.5 ms
Lead Channel Pacing Threshold Pulse Width: 0.5 ms
Lead Channel Pacing Threshold Pulse Width: 0.5 ms
Lead Channel Pacing Threshold Pulse Width: 1 ms
Lead Channel Pacing Threshold Pulse Width: 1 ms
Lead Channel Sensing Intrinsic Amplitude: 11.5 mV
Lead Channel Sensing Intrinsic Amplitude: 5 mV
Lead Channel Setting Pacing Amplitude: 1.625
Lead Channel Setting Pacing Amplitude: 3.5 V
Lead Channel Setting Pacing Amplitude: 3.5 V
Lead Channel Setting Pacing Pulse Width: 0.5 ms
Lead Channel Setting Pacing Pulse Width: 1 ms
Lead Channel Setting Sensing Sensitivity: 0.5 mV
Pulse Gen Serial Number: 211001542
Zone Setting Status: 755011

## 2022-05-31 NOTE — Patient Instructions (Signed)

## 2022-05-31 NOTE — Progress Notes (Signed)
Wound check appointment. Steri-strips removed. Wound without redness or edema. Incision edges approximated, wound well healed.  LV threshold 2.75 V at 0.50ms Proximal 4 to RVC.  Outreach made to [employer].  [employer] rep advised pulling out pulse width to 1 ms.  Threshold 2.25 V at 1 ms.  Advised to change LV output to 3.5V at 1.0 ms until 91 day follow up.  Programming change made.  Impedance for LV lead stable. Normal device function. RA/RV thresholds, sensing, and impedances consistent with implant measurements. Device programmed at 3.5V for extra safety margin until 3 month visit. Histogram distribution appropriate for patient and level of activity. No mode switches or ventricular arrhythmias noted. Patient educated about wound care, arm mobility, lifting restrictions, shock plan. ROV in 3 months with implanting physician.

## 2022-07-21 ENCOUNTER — Other Ambulatory Visit: Payer: Self-pay | Admitting: Cardiology

## 2022-08-21 ENCOUNTER — Ambulatory Visit: Payer: BC Managed Care – PPO

## 2022-08-21 ENCOUNTER — Ambulatory Visit: Payer: BC Managed Care – PPO | Admitting: Cardiology

## 2022-08-21 DIAGNOSIS — I428 Other cardiomyopathies: Secondary | ICD-10-CM

## 2022-08-21 DIAGNOSIS — I447 Left bundle-branch block, unspecified: Secondary | ICD-10-CM | POA: Diagnosis not present

## 2022-08-21 LAB — CUP PACEART REMOTE DEVICE CHECK
Battery Remaining Longevity: 55 mo
Battery Remaining Percentage: 91 %
Battery Voltage: 2.98 V
Brady Statistic AP VP Percent: 1.3 %
Brady Statistic AP VS Percent: 1 %
Brady Statistic AS VP Percent: 97 %
Brady Statistic AS VS Percent: 1 %
Brady Statistic RA Percent Paced: 1 %
Date Time Interrogation Session: 20240226020100
HighPow Impedance: 70 Ohm
Implantable Lead Connection Status: 753985
Implantable Lead Connection Status: 753985
Implantable Lead Connection Status: 753985
Implantable Lead Implant Date: 20231124
Implantable Lead Implant Date: 20231124
Implantable Lead Implant Date: 20231124
Implantable Lead Location: 753858
Implantable Lead Location: 753859
Implantable Lead Location: 753860
Implantable Pulse Generator Implant Date: 20231124
Lead Channel Impedance Value: 490 Ohm
Lead Channel Impedance Value: 500 Ohm
Lead Channel Impedance Value: 590 Ohm
Lead Channel Pacing Threshold Amplitude: 0.5 V
Lead Channel Pacing Threshold Amplitude: 1 V
Lead Channel Pacing Threshold Amplitude: 2.25 V
Lead Channel Pacing Threshold Pulse Width: 0.5 ms
Lead Channel Pacing Threshold Pulse Width: 0.5 ms
Lead Channel Pacing Threshold Pulse Width: 1 ms
Lead Channel Sensing Intrinsic Amplitude: 11.5 mV
Lead Channel Sensing Intrinsic Amplitude: 5 mV
Lead Channel Setting Pacing Amplitude: 1.5 V
Lead Channel Setting Pacing Amplitude: 3.5 V
Lead Channel Setting Pacing Amplitude: 3.5 V
Lead Channel Setting Pacing Pulse Width: 0.5 ms
Lead Channel Setting Pacing Pulse Width: 1 ms
Lead Channel Setting Sensing Sensitivity: 0.5 mV
Pulse Gen Serial Number: 211001542
Zone Setting Status: 755011

## 2022-09-18 ENCOUNTER — Encounter: Payer: BC Managed Care – PPO | Admitting: Cardiology

## 2022-09-27 ENCOUNTER — Encounter: Payer: Self-pay | Admitting: Cardiology

## 2022-09-27 ENCOUNTER — Ambulatory Visit: Payer: BC Managed Care – PPO | Attending: Cardiology | Admitting: Cardiology

## 2022-09-27 VITALS — BP 116/74 | HR 80 | Ht 69.0 in | Wt 360.4 lb

## 2022-09-27 DIAGNOSIS — I5022 Chronic systolic (congestive) heart failure: Secondary | ICD-10-CM | POA: Diagnosis not present

## 2022-09-27 DIAGNOSIS — I493 Ventricular premature depolarization: Secondary | ICD-10-CM | POA: Diagnosis not present

## 2022-09-27 DIAGNOSIS — I428 Other cardiomyopathies: Secondary | ICD-10-CM

## 2022-09-27 NOTE — Patient Instructions (Signed)
Medication Instructions:  Your physician recommends that you continue on your current medications as directed. Please refer to the Current Medication list given to you today.  *If you need a refill on your cardiac medications before your next appointment, please call your pharmacy*  Lab Work: None ordered  Testing/Procedures: None ordered  Follow-Up: At [facility], you and your health needs are our priority.  As part of our continuing mission to provide you with exceptional heart care, we have created designated Provider Care Teams.  These Care Teams include your primary Cardiologist (physician) and Advanced Practice Providers (APPs -  Physician Assistants and Nurse Practitioners) who all work together to provide you with the care you need, when you need it.  Your next appointment:   1 year(s)  The format for your next appointment:   In Person  Provider:   You may see Will Meredith Leeds, MD or one of the following Advanced Practice Providers on your designated Care Team:   Tommye Standard, [place] Legrand Como "[place]" [place], PA-C [place], [place]

## 2022-09-27 NOTE — Progress Notes (Signed)
Electrophysiology Office Note   Date:  09/27/2022   ID:  Alex Allen, DOB 07/29/1980, MRN [id number]  PCP:  [facility] [place]  Cardiologist:  Ellyn Hack Primary Electrophysiologist:  Karyme Mcconathy Meredith Leeds, MD    Chief Complaint: CHF   History of Present Illness: Alex Allen is a 42 y.o. male who is being seen today for the evaluation of CHF at the request of [employer], Alex Allen Family M*. Presenting today for electrophysiology evaluation.  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  He had an echo that showed an ejection fraction of 20% with left bundle branch block.  Cardiac MRI showed a persistently low ejection fraction.  He is status post Abbott CRT-D implanted 05/19/2022.  Today, denies symptoms of palpitations, chest pain, shortness of breath, orthopnea, PND, lower extremity edema, claudication, dizziness, presyncope, syncope, bleeding, or neurologic sequela. The patient is tolerating medications without difficulties.  His ICD was implanted he has done well.  He has had no chest pain or shortness of breath.  He is able to do all of his daily activities.  He has quite a bit less fatigue and feels much more energy.   Past Medical History:  Diagnosis Date   Chronic combined systolic and diastolic CHF (congestive heart failure)    LVEF of 30% on Echo in 08/2020   Coronary artery disease, non-occlusive    non-obstructive CAD on LHC in 08/2020   Hypertension    LBBB (left bundle branch block)    Morbid obesity    Nephrolithiasis 11/2020   Repeat visit in February 2023   Non-ischemic cardiomyopathy 08/2020   Noted in the setting of hypertensive emergency.  EF 30% by echo.  Moderate severely reduced function.  Minimal CAD on [facility] with relatively normal RHC pressures.  Follow-up Echo May 2023-EF <20%.   Obstructive sleep apnea    Past Surgical History:  Procedure  Laterality Date   BIV ICD INSERTION CRT-D N/A 05/19/2022   Procedure: BIV ICD INSERTION CRT-D;  Surgeon: Constance Haw, MD;  Location: [place] [facility];  Service: Cardiovascular;  Laterality: N/A;   RIGHT/LEFT HEART CATH AND CORONARY ANGIOGRAPHY N/A 09/21/2020   Procedure: RIGHT/LEFT HEART CATH AND CORONARY ANGIOGRAPHY;  Surgeon: Troy Sine, MD;  Location: [place] [facility]:: Proximal LAD 10%, mid LAD 20%.  D1 50%.  Proximal LCx 30%, mid RCA 20% and RPDA 30%.  Very large caliber coronary arteries.  Minimally elevated right heart Pressures.  Fick CO-CI 7.1-2.6   TRANSTHORACIC ECHOCARDIOGRAM  09/20/2020   EF~30%.  Moderate-severely reduced LV function.  Baseline admitted inferior and inferoseptal akinesis with severe anteroseptal hypokinesis.  Moderate LVH.  Indeterminate diastolic function with mild LA dilation.  Unable to assess PAP dilated IVC suggesting RAP 15 mmHg.   TRANSTHORACIC ECHOCARDIOGRAM  11/10/2021   EF <20% with severely reduced function.  Dilated LV.  Entire inferior wall, mid inferoseptal basilar septal wall are akinetic anterior and lateral wall are hypokinetic.   ZIO PATCH MONITOR  09/2021   Sinus rhythm rates 61 129 bpm, average 84 bpm.  No PACs noted.  Occasional isolated PVCs (4.9%) and occasional (1.1%) couplets.  No bigeminy or trigeminy.     Current Outpatient Medications  Medication Sig Dispense Refill   albuterol (VENTOLIN HFA) 108 (90 Base) MCG/ACT inhaler Inhale 2 puffs into the lungs every 6 (six) hours as needed for wheezing or shortness of breath. 8 g 2  aspirin (ASPIRIN ADULT LOW STRENGTH) 81 MG EC tablet Take 1 tablet (81 mg total) by mouth daily. Take 1 tablet (81mg ) ONCE daily. 90 tablet 3   atorvastatin (LIPITOR) 40 MG tablet TAKE 1 TABLET BY MOUTH DAILY. 90 tablet 2   carvedilol (COREG) 12.5 MG tablet Take 1 tablet (12.5 mg total) by mouth 2 (two) times daily with a meal. 180 tablet 3   dapagliflozin propanediol (FARXIGA) 10 MG TABS tablet TAKE  1 TABLET (10 MG TOTAL) BY MOUTH DAILY. 90 tablet 3   furosemide (LASIX) 40 MG tablet TAKE 1 TABLET (40 MG TOTAL) BY MOUTH DAILY. 90 tablet 2   sacubitril-valsartan (ENTRESTO) 49-51 MG TAKE 1 TABLET BY MOUTH 2 TIMES DAILY. 60 tablet 11   spironolactone (ALDACTONE) 25 MG tablet TAKE 1 TABLET (25 MG TOTAL) BY MOUTH DAILY. 90 tablet 3   No current facility-administered medications for this visit.    Allergies:   Patient has no known allergies.   Social History:  The patient  reports that he quit smoking about 2 years ago. His smoking use included cigars. He has never used smokeless tobacco. He reports current alcohol use. He reports that he does not use drugs.   Family History:  The patient's family history includes Diabetes in his father; Heart failure in his father; Hyperlipidemia in his father; Hypertension in his father; Thyroid disease in his mother.   ROS:  Please see the history of present illness.   Otherwise, review of systems is positive for none.   All other systems are reviewed and negative.   PHYSICAL EXAM: VS:  BP 116/74   Pulse 80   Ht 5\' 9"  (1.753 m)   Wt (!) 360 lb 6.4 oz (163.5 kg)   SpO2 97%   BMI 53.22 kg/m  , BMI Body mass index is 53.22 kg/m. GEN: Well nourished, well developed, in no acute distress  HEENT: normal  Neck: no JVD, carotid bruits, or masses Cardiac: RRR; no murmurs, rubs, or gallops,no edema  Respiratory:  clear to auscultation bilaterally, normal work of breathing GI: soft, nontender, nondistended, + BS MS: no deformity or atrophy  Skin: warm and dry, device site well healed Neuro:  Strength and sensation are intact Psych: euthymic mood, full affect  EKG:  EKG is ordered today. Personal review of the ekg ordered shows sinus rhythm, V paced  Personal review of the device interrogation today. Results in [place]: 01/27/2022: B Natriuretic Peptide 34.8 05/15/2022: BUN 13; Creatinine, Ser 0.90; Hemoglobin 16.2; Platelets 203;  Potassium 4.2; Sodium 138    Lipid Panel     Component Value Date/Time   CHOL 87 01/27/2022 1136   TRIG 86 01/27/2022 1136   HDL 29 (L) 01/27/2022 1136   CHOLHDL 3.0 01/27/2022 1136   VLDL 17 01/27/2022 1136   LDLCALC 41 01/27/2022 1136     Wt Readings from Last 3 Encounters:  09/27/22 (!) 360 lb 6.4 oz (163.5 kg)  05/19/22 (!) 350 lb (158.8 kg)  03/14/22 (!) 346 lb 3.2 oz (157 kg)      Other studies Reviewed: Additional studies/ records that were reviewed today include: CMRI 01/24/2022 Review of the above records today demonstrates:  1. Severe LV dilation with EF 26%. Global hypokinesis with septal-lateral dyssynchrony consistent with LBBB.   2. Normal RV size with moderately decreased systolic function, EF A999333.   3. Nonspecific RV insertion site LGE. This can be seen with pressure/volume overload.   4.  Normal extracellular volume percentage.  ASSESSMENT AND PLAN:  1. Chronic systolic heart failure: Due to nonischemic cardiomyopathy.  Currently off to medical therapy with carvedilol, RCA, Entresto, Aldactone.  He has now status post Abbott CRT-D implanted 05/19/2022.  Device functioning appropriately.  Threshold, sensing, impedance within normal limits.  No changes.  2.  Hyperlipidemia: Continue atorvastatin per primary cardiology  3.  PVCs: 6% burden on cardiac monitor.    Current medicines are reviewed at length with the patient today.   The patient does not have concerns regarding his medicines.  The following changes were made today:  none  Labs/ tests ordered today include:  Orders Placed This Encounter  Procedures   EKG 12-Lead     Disposition:   FU 12 months  Signed, Anthonee Gelin Meredith Leeds, MD  09/27/2022 4:50 PM     [address] [place] [address] [phone] (office) [phone] (fax)

## 2022-09-29 NOTE — Progress Notes (Signed)
Remote ICD transmission.   

## 2022-10-28 ENCOUNTER — Other Ambulatory Visit: Payer: Self-pay | Admitting: Student

## 2022-10-28 DIAGNOSIS — I2510 Atherosclerotic heart disease of native coronary artery without angina pectoris: Secondary | ICD-10-CM

## 2022-11-15 ENCOUNTER — Other Ambulatory Visit (HOSPITAL_COMMUNITY): Payer: Self-pay

## 2022-11-17 IMAGING — DX DG CHEST 1V
1 series · 1 of 1 positions shown · non-contrast
Comparison: 09/19/2020.

CLINICAL DATA: Respiratory failure.  Shortness of breath.

EXAM:
CHEST  1 VIEW

[chest ap]
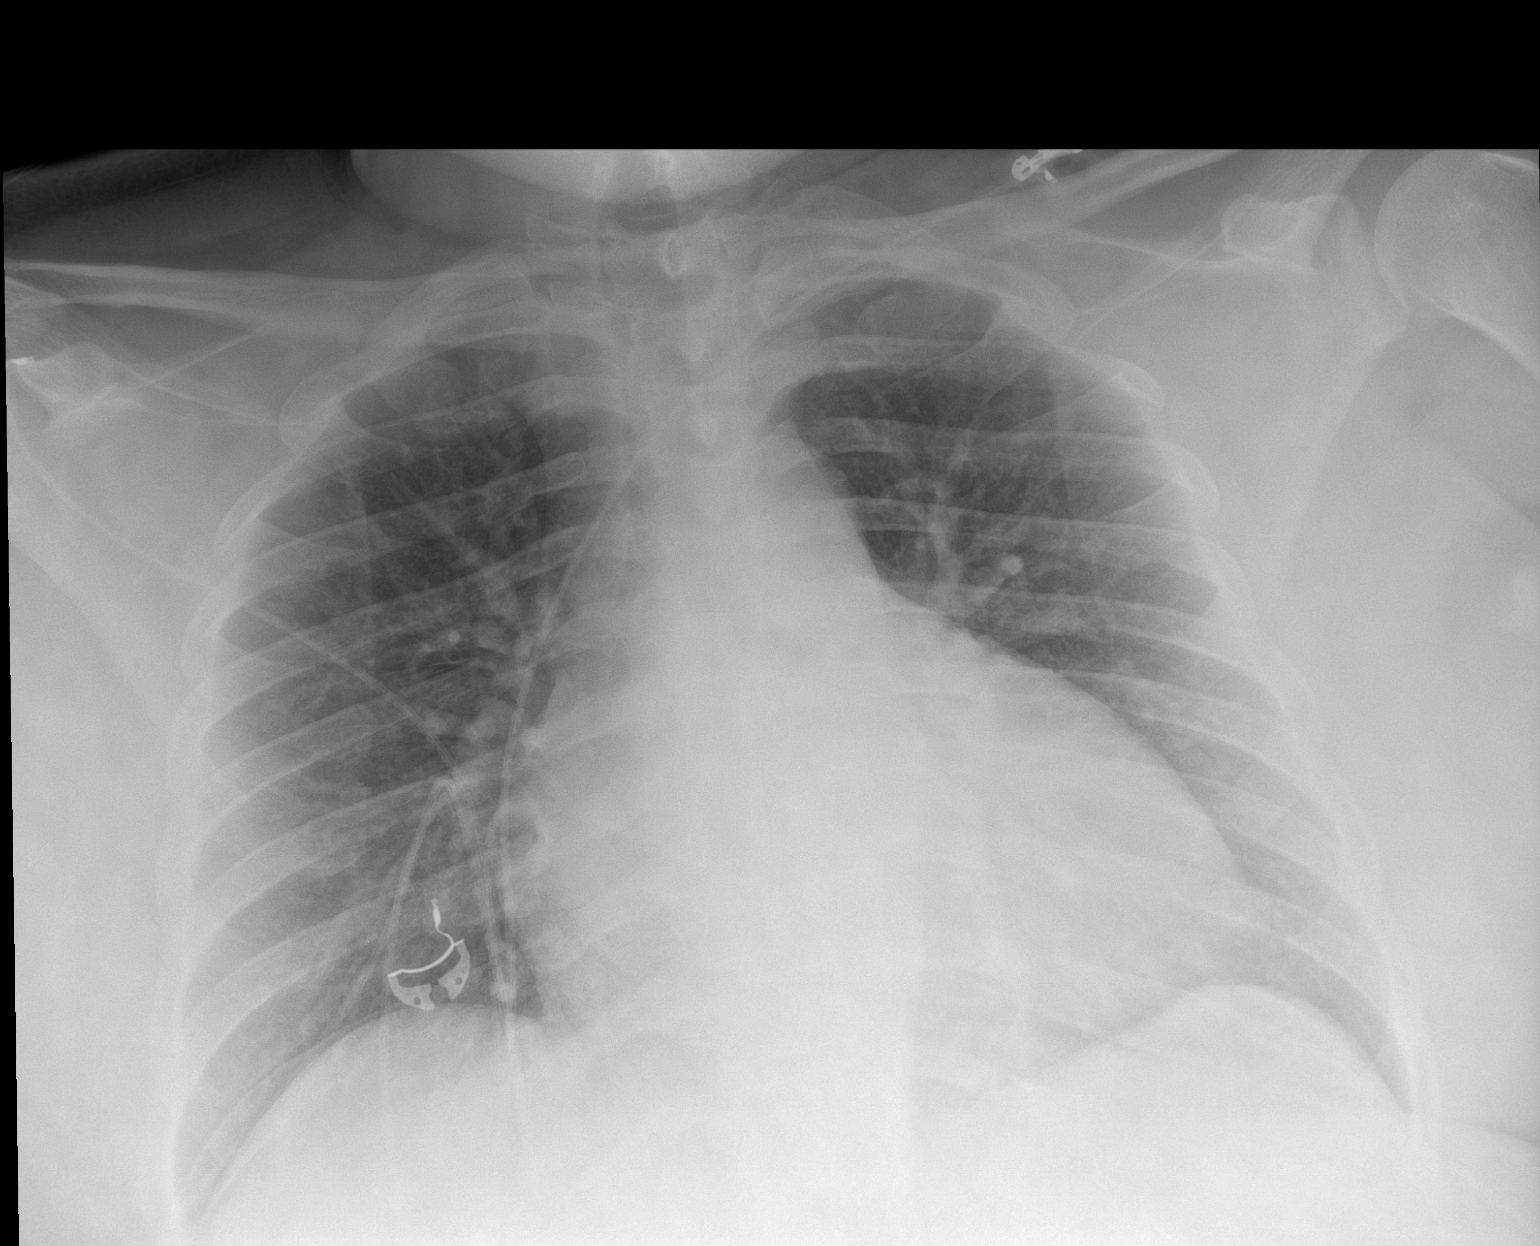

[1 of 1 positions shown; findings below may reference images not displayed]

FINDINGS: Cardiomegaly. No pulmonary venous congestion. Bilateral pulmonary
infiltrates consistent with clearing pulmonary edema. No pleural
effusion or pneumothorax. No acute bony abnormality.
IMPRESSION: Cardiomegaly. No pulmonary venous congestion. Interval clearing of
bilateral pulmonary infiltrates consistent with clearing pulmonary
edema.

## 2022-11-21 ENCOUNTER — Ambulatory Visit (INDEPENDENT_AMBULATORY_CARE_PROVIDER_SITE_OTHER): Payer: BC Managed Care – PPO

## 2022-11-21 DIAGNOSIS — I447 Left bundle-branch block, unspecified: Secondary | ICD-10-CM | POA: Diagnosis not present

## 2022-11-22 LAB — CUP PACEART REMOTE DEVICE CHECK
Battery Remaining Longevity: 58 mo
Battery Remaining Percentage: 87 %
Battery Voltage: 2.96 V
Brady Statistic AP VP Percent: 1.2 %
Brady Statistic AP VS Percent: 1 %
Brady Statistic AS VP Percent: 98 %
Brady Statistic AS VS Percent: 1 %
Brady Statistic RA Percent Paced: 1 %
Date Time Interrogation Session: 20240528051238
HighPow Impedance: 74 Ohm
Implantable Lead Connection Status: 753985
Implantable Lead Connection Status: 753985
Implantable Lead Connection Status: 753985
Implantable Lead Implant Date: 20231124
Implantable Lead Implant Date: 20231124
Implantable Lead Implant Date: 20231124
Implantable Lead Location: 753858
Implantable Lead Location: 753859
Implantable Lead Location: 753860
Implantable Pulse Generator Implant Date: 20231124
Lead Channel Impedance Value: 460 Ohm
Lead Channel Impedance Value: 550 Ohm
Lead Channel Impedance Value: 630 Ohm
Lead Channel Pacing Threshold Amplitude: 0.75 V
Lead Channel Pacing Threshold Amplitude: 1.25 V
Lead Channel Pacing Threshold Amplitude: 3.5 V
Lead Channel Pacing Threshold Pulse Width: 0.3 ms
Lead Channel Pacing Threshold Pulse Width: 0.5 ms
Lead Channel Pacing Threshold Pulse Width: 1 ms
Lead Channel Sensing Intrinsic Amplitude: 11.5 mV
Lead Channel Sensing Intrinsic Amplitude: 5 mV
Lead Channel Setting Pacing Amplitude: 1.25 V
Lead Channel Setting Pacing Amplitude: 2.25 V
Lead Channel Setting Pacing Amplitude: 4 V
Lead Channel Setting Pacing Pulse Width: 0.3 ms
Lead Channel Setting Pacing Pulse Width: 1 ms
Lead Channel Setting Sensing Sensitivity: 0.5 mV
Pulse Gen Serial Number: 211001542
Zone Setting Status: 755011

## 2022-11-25 ENCOUNTER — Other Ambulatory Visit: Payer: Self-pay | Admitting: Cardiology

## 2022-12-05 ENCOUNTER — Other Ambulatory Visit: Payer: Self-pay | Admitting: Cardiology

## 2022-12-14 NOTE — Progress Notes (Signed)
Remote ICD transmission.   

## 2023-01-04 ENCOUNTER — Telehealth: Payer: Self-pay

## 2023-01-04 ENCOUNTER — Other Ambulatory Visit (HOSPITAL_COMMUNITY): Payer: Self-pay

## 2023-01-04 NOTE — Telephone Encounter (Signed)
Pharmacy Patient Advocate Encounter   Received notification from [facility] that prior authorization for ENTRESTO is needed.    PA submitted on 01/04/23 Key [id number] Status is pending  Haze Rushing, CPhT Pharmacy Patient Advocate Specialist Fax: [phone]

## 2023-01-05 NOTE — Telephone Encounter (Signed)
Pharmacy Patient Advocate Encounter  Prior Authorization for Sherryll Burger has been approved.    Effective dates: 01/04/23-01/04/24   Haze Rushing, CPhT Pharmacy Patient Advocate Specialist Fax: [phone]

## 2023-01-30 ENCOUNTER — Telehealth: Payer: Self-pay | Admitting: Cardiology

## 2023-01-30 MED ORDER — FUROSEMIDE 40 MG PO TABS: 40.0000 mg | ORAL_TABLET | Freq: Every day | ORAL | 2 refills | Status: DC

## 2023-01-30 MED ORDER — ATORVASTATIN CALCIUM 40 MG PO TABS: 40.0000 mg | ORAL_TABLET | Freq: Every day | ORAL | 2 refills | Status: DC

## 2023-01-30 NOTE — Telephone Encounter (Signed)
Pt's medication was sent to pt's pharmacy as requested. Confirmation received.  °

## 2023-01-30 NOTE — Telephone Encounter (Signed)
*  STAT* If patient is at the pharmacy, call can be transferred to refill team.   1. Which medications need to be refilled? (please list name of each medication and dose if known) atorvastatin (LIPITOR) 40 MG tablet   furosemide (LASIX) 40 MG tablet   2. Which pharmacy/location (including street and city if local pharmacy) is medication to be sent to? [id number] [place], [place] - [address]   3. Do they need a 30 day or 90 day supply? 90

## 2023-02-20 ENCOUNTER — Ambulatory Visit (INDEPENDENT_AMBULATORY_CARE_PROVIDER_SITE_OTHER): Payer: BC Managed Care – PPO

## 2023-02-20 DIAGNOSIS — I447 Left bundle-branch block, unspecified: Secondary | ICD-10-CM | POA: Diagnosis not present

## 2023-02-20 LAB — CUP PACEART REMOTE DEVICE CHECK
Battery Remaining Longevity: 54 mo
Battery Remaining Percentage: 82 %
Battery Voltage: 2.96 V
Brady Statistic AP VP Percent: 1 %
Brady Statistic AP VS Percent: 1 %
Brady Statistic AS VP Percent: 98 %
Brady Statistic AS VS Percent: 1 %
Brady Statistic RA Percent Paced: 1 %
Date Time Interrogation Session: 20240827030508
HighPow Impedance: 75 Ohm
Implantable Lead Connection Status: 753985
Implantable Lead Connection Status: 753985
Implantable Lead Connection Status: 753985
Implantable Lead Implant Date: 20231124
Implantable Lead Implant Date: 20231124
Implantable Lead Implant Date: 20231124
Implantable Lead Location: 753858
Implantable Lead Location: 753859
Implantable Lead Location: 753860
Implantable Pulse Generator Implant Date: 20231124
Lead Channel Impedance Value: 500 Ohm
Lead Channel Impedance Value: 510 Ohm
Lead Channel Impedance Value: 650 Ohm
Lead Channel Pacing Threshold Amplitude: 0.75 V
Lead Channel Pacing Threshold Amplitude: 1 V
Lead Channel Pacing Threshold Amplitude: 3.25 V
Lead Channel Pacing Threshold Pulse Width: 0.3 ms
Lead Channel Pacing Threshold Pulse Width: 0.5 ms
Lead Channel Pacing Threshold Pulse Width: 1 ms
Lead Channel Sensing Intrinsic Amplitude: 11.5 mV
Lead Channel Sensing Intrinsic Amplitude: 5 mV
Lead Channel Setting Pacing Amplitude: 1.25 V
Lead Channel Setting Pacing Amplitude: 2 V
Lead Channel Setting Pacing Amplitude: 3.75 V
Lead Channel Setting Pacing Pulse Width: 0.3 ms
Lead Channel Setting Pacing Pulse Width: 1 ms
Lead Channel Setting Sensing Sensitivity: 0.5 mV
Pulse Gen Serial Number: 211001542
Zone Setting Status: 755011

## 2023-02-26 ENCOUNTER — Other Ambulatory Visit (HOSPITAL_COMMUNITY): Payer: Self-pay | Admitting: Cardiology

## 2023-03-01 NOTE — Telephone Encounter (Signed)
This is a CHF pt that Dr. Shirlee Latch has not released. Pt is still supposed to be seen in CHF clinic. Has pt been released? Please address

## 2023-03-05 NOTE — Progress Notes (Signed)
Remote ICD transmission.   

## 2023-03-07 ENCOUNTER — Other Ambulatory Visit (HOSPITAL_COMMUNITY): Payer: Self-pay

## 2023-03-19 ENCOUNTER — Other Ambulatory Visit: Payer: Self-pay

## 2023-03-19 NOTE — Telephone Encounter (Signed)
Pt states that he needed a new prior auth for his new insurance for medication Entresto. Please address

## 2023-03-19 NOTE — Telephone Encounter (Signed)
Pt stated the he has moved out of the state and has not found a provider and would like Dr. Herbie Baltimore to refill his medication Sherryll Burger for at least 3 months, until he can find a provider. Would Dr. Herbie Baltimore like to refill this medication? Please address

## 2023-03-20 ENCOUNTER — Other Ambulatory Visit (HOSPITAL_COMMUNITY): Payer: Self-pay

## 2023-03-20 ENCOUNTER — Encounter: Payer: Self-pay | Admitting: Pharmacy Technician

## 2023-03-20 NOTE — Telephone Encounter (Signed)
error 

## 2023-03-21 ENCOUNTER — Other Ambulatory Visit (HOSPITAL_COMMUNITY): Payer: Self-pay

## 2023-03-21 ENCOUNTER — Telehealth: Payer: Self-pay

## 2023-03-21 MED ORDER — ENTRESTO 49-51 MG PO TABS: 1.0000 | ORAL_TABLET | Freq: Two times a day (BID) | ORAL | 0 refills | Status: DC

## 2023-03-21 NOTE — Telephone Encounter (Signed)
PA request has been Submitted. New Encounter created for follow up. For additional info see Pharmacy Prior Auth telephone encounter from 03/21/23.

## 2023-03-21 NOTE — Telephone Encounter (Signed)
Pharmacy Patient Advocate Encounter  Received notification from [facility] that Prior Authorization for ENTRESTO has been APPROVED from 03/20/23 to 03/19/26. Ran test claim, Copay is $80. This test claim was processed through [facility]- copay amounts may vary at other pharmacies due to pharmacy/plan contracts, or as the patient moves through the different stages of their insurance plan.

## 2023-03-21 NOTE — Telephone Encounter (Signed)
Pharmacy Patient Advocate Encounter   Received notification from Physician's Office that prior authorization for ENTRESTO is required/requested.   Insurance verification completed.   The patient is insured through [facility] .   Per test claim: PA required; PA submitted to [facility] via CoverMyMeds Key/confirmation #/EOC [id number] Status is pending

## 2023-03-22 ENCOUNTER — Other Ambulatory Visit: Payer: Self-pay

## 2023-03-22 ENCOUNTER — Telehealth: Payer: Self-pay | Admitting: Cardiology

## 2023-03-22 MED ORDER — CARVEDILOL 12.5 MG PO TABS: 12.5000 mg | ORAL_TABLET | Freq: Two times a day (BID) | ORAL | 0 refills | Status: DC

## 2023-03-22 MED ORDER — SPIRONOLACTONE 25 MG PO TABS: 25.0000 mg | ORAL_TABLET | Freq: Every day | ORAL | 0 refills | Status: DC

## 2023-03-22 NOTE — Telephone Encounter (Signed)
Patient is requesting call back to see if he can schedule a virtual appt with Dr. Herbie Baltimore due to [place]. Please advise.

## 2023-03-22 NOTE — Telephone Encounter (Signed)
Patient called and advised we cannot do virtual as he has not been seen in  over a year.  He is set for an appt for 10/15 for 10:55 am.  Changed insurance so he is advised to bring insurance card and to also check to see if we are network.

## 2023-03-22 NOTE — Telephone Encounter (Signed)
Pt's medications were sent to pt's pharmacy as requested. Confirmation received.  

## 2023-04-08 NOTE — Progress Notes (Unsigned)
Cardiology Clinic Note   Patient Name: Alex Allen Date of Encounter: 04/10/2023  Primary Care Provider:  [facility] [place] [facility] Cardiologist:  Bryan Lemma, MD  Patient Profile    Alex Allen 42 year old male presents the clinic today for follow-up evaluation of his hypertension and nonischemic cardiomyopathy.   Past Medical History    Past Medical History:  Diagnosis Date   Chronic combined systolic and diastolic CHF (congestive heart failure) (HCC)    LVEF of 30% on Echo in 08/2020   Coronary artery disease, non-occlusive    non-obstructive CAD on LHC in 08/2020   Hypertension    LBBB (left bundle branch block)    Morbid obesity (HCC)    Nephrolithiasis 11/2020   Repeat visit in February 2023   Non-ischemic cardiomyopathy (HCC) 08/2020   Noted in the setting of hypertensive emergency.  EF 30% by echo.  Moderate severely reduced function.  Minimal CAD on [facility] with relatively normal RHC pressures.  Follow-up Echo May 2023-EF <20%.   Obstructive sleep apnea    Past Surgical History:  Procedure Laterality Date   BIV ICD INSERTION CRT-D N/A 05/19/2022   Procedure: BIV ICD INSERTION CRT-D;  Surgeon: Regan Lemming, MD;  Location: [facility] INVASIVE CV LAB;  Service: Cardiovascular;  Laterality: N/A;   RIGHT/LEFT HEART CATH AND CORONARY ANGIOGRAPHY N/A 09/21/2020   Procedure: RIGHT/LEFT HEART CATH AND CORONARY ANGIOGRAPHY;  Surgeon: Lennette Bihari, MD;  Location: [facility] INVASIVE CV LAB:: Proximal LAD 10%, mid LAD 20%.  D1 50%.  Proximal LCx 30%, mid RCA 20% and RPDA 30%.  Very large caliber coronary arteries.  Minimally elevated right heart Pressures.  Fick CO-CI 7.1-2.6   TRANSTHORACIC ECHOCARDIOGRAM  09/20/2020   EF~30%.  Moderate-severely reduced LV function.  Baseline admitted inferior and inferoseptal akinesis with severe anteroseptal hypokinesis.  Moderate LVH.  Indeterminate diastolic function with mild LA dilation.  Unable to assess PAP  dilated IVC suggesting RAP 15 mmHg.   TRANSTHORACIC ECHOCARDIOGRAM  11/10/2021   EF <20% with severely reduced function.  Dilated LV.  Entire inferior wall, mid inferoseptal basilar septal wall are akinetic anterior and lateral wall are hypokinetic.   ZIO PATCH MONITOR  09/2021   Sinus rhythm rates 61 129 bpm, average 84 bpm.  No PACs noted.  Occasional isolated PVCs (4.9%) and occasional (1.1%) couplets.  No bigeminy or trigeminy.    Allergies  No Known Allergies  History of Present Illness    Alex Allen is a PMH of hypertension, hyperlipidemia, acute respiratory failure with hypoxemia, acute systolic CHF, nonischemic cardiomyopathy, and nonobstructive coronary artery disease.  His PMH also includes COVID-19 infection, and LBBB.  He was first diagnosed with high blood pressure in 2010.  He continued with his antihypertensive medications but unfortunately stopped in 2018.  He previously reported his medication.  Due to weight loss.  He stopped following up with his PCP and was seen for hypertensive urgency in the emergency department.   He was seen by Dr. Frances Furbish 10/12/2020.  During that time he reported that he was doing well.  He did note that he was having low energy.  He reported his breathing was doing well.  He did note that he felt tired and rundown through the day.  He was not sure if his fatigue was related to his medications or his sleep apnea.  He had been weighing himself daily and reported stable weights between 347-350 pounds.  His resting heart rate was in 70s.  He had not been getting much exercise and reported that he was afraid to increase his physical activity.  He denied dyspnea and chest pain.  He was referred for sleep study and his furosemide was transitioned to as needed.   He presented to the clinic 10/28/2020 for follow-up evaluation stated he felt well.  He felt like he was beginning to have more pep in his step again.  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  His weight had been stable.  His blood pressure was 100/72.  We reviewed his echocardiogram and he expressed understanding.   I provided him with a return to work note, asked him to increase his physical activity as tolerated give him salty 6 diet sheet, continue his current medication regimen, and planned follow-up with Dr. Herbie Baltimore in 3 to 4 months.   Presented to the emergency department 12/16/2020 with complaints of flank pain and discolored urine.  He reported that his left-sided flank pain has been present for 1 day.  It was described as sharp and intermittent.  He was treated with Keflex, Flomax, and tramadol.  He was asked to follow-up with his PCP.   He presented to the clinic 02/07/21 for follow-up evaluation stated he felt well.  He had been increasing his physical activity and had noticed only 1 episode of chest discomfort while he was walking.  He reported that he had not had any further episodes.  He was using several seasonings on his food that did not include salt.  His weight was down to 336 pounds from 351 pounds.  He reported that he had a CPAP and was using it regularly.  He was waking up well rested.  He reported that he occasionally had alerts on his blood pressure cuff that indicated he was having irregular heartbeats.  We reviewed his iPhone EKGs which showed normal sinus rhythm and sinus tachycardia.  He had returned to work full-time. Follow up in 6 months was planned.   He was seen by Dr. Elberta Fortis 09/27/2022.  His echocardiogram showed an EF of 20% with left bundle branch block and his cardiac MRI showed persistently low EF.  He underwent CRT-D implant 05/19/2022.  On follow-up he denied palpitations, chest pain and shortness of breath.  He denied bleeding issues.  He was tolerating his medications well.  His device was functioning well.  His PVC burden was noted to be 6%.  He denied chest pain and shortness of breath.  He was able to do all of his daily activities.  He did  note less fatigue and felt that he had more energy.  His blood pressure was 116/74.  He presents to the clinic today for follow-up evaluation.  He is doing very well.  He has moved to [facility] to be closer to family and for work.  He is in the process of establishing care with cardiology and primary care there.  We reviewed his medications.  I will order CBC, CMP, lipid panel, refill his current medical therapy and forward his cardiac records once he has establish care with new cardiologist.   Today he denies chest pain, shortness of breath, lower extremity edema, fatigue, palpitations, melena, hematuria, hemoptysis, diaphoresis, weakness, presyncope, syncope, orthopnea, and PND.  Home Medications    Prior to Admission medications   Medication Sig Start Date End Date Taking? Authorizing Provider  albuterol (VENTOLIN HFA) 108 (90 Base) MCG/ACT inhaler Inhale 2 puffs into the lungs every 6 (six) hours as needed for  wheezing or shortness of breath. 09/22/20   Pokhrel, Laxman, MD  ASPIRIN LOW DOSE 81 MG tablet TAKE 1 TABLET BY MOUTH DAILY. 10/30/22   Camnitz, Andree Coss, MD  atorvastatin (LIPITOR) 40 MG tablet Take 1 tablet (40 mg total) by mouth daily. 01/30/23   Camnitz, Andree Coss, MD  carvedilol (COREG) 12.5 MG tablet Take 1 tablet (12.5 mg total) by mouth 2 (two) times daily with a meal. 03/22/23   Marykay Lex, MD  dapagliflozin propanediol (FARXIGA) 10 MG TABS tablet TAKE 1 TABLET BY MOUTH EVERY DAY. NEED FOLLOW UP APPOINTMNET FOR MORE REFILLS 03/01/23   Laurey Morale, MD  furosemide (LASIX) 40 MG tablet Take 1 tablet (40 mg total) by mouth daily. 01/30/23   Camnitz, Will Daphine Deutscher, MD  sacubitril-valsartan (ENTRESTO) 49-51 MG Take 1 tablet by mouth 2 (two) times daily. 03/21/23   Marykay Lex, MD  spironolactone (ALDACTONE) 25 MG tablet Take 1 tablet (25 mg total) by mouth daily. 03/22/23   Marykay Lex, MD    Family History    Family History  Problem Relation Age of Onset   Thyroid  disease Mother    Diabetes Father    Hypertension Father    Hyperlipidemia Father    Heart failure Father        Just diagnosed   He indicated that his mother is alive. He indicated that his father is alive. He indicated that all of his three sisters are alive. He indicated that both of his brothers are alive. He indicated that his maternal grandmother is alive. He indicated that his maternal grandfather is alive. He indicated that the status of his paternal grandmother is unknown. He indicated that his paternal grandfather is deceased.  Social History    Social History   Socioeconomic History   Marital status: Single    Spouse name: Not on file   Number of children: Not on file   Years of education: 18   Highest education level: Doctorate  Occupational History   Occupation: Exceptional Photographer: Veterinary surgeon [employer]  Tobacco Use   Smoking status: Former    Types: Cigars    Quit date: 09/11/2020    Years since quitting: 2.5   Smokeless tobacco: Never  Vaping Use   Vaping status: Never Used  Substance and Sexual Activity   Alcohol use: Yes    Comment: Socially   Drug use: No   Sexual activity: Not Currently  Other Topics Concern   Not on file  Social History Narrative   Works as a Runner, 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   Social Determinants of Health   Financial Resource Strain: Medium Risk (09/27/2020)   Overall Financial Resource Strain (CARDIA)    Difficulty of Paying Living Expenses: Somewhat hard  Food Insecurity: No Food Insecurity (09/21/2020)   Hunger Vital Sign    Worried About Running Out of Food in the Last Year: Never true    Ran Out of Food in the Last Year: Never true  Transportation Needs: No Transportation Needs (09/21/2020)   PRAPARE - Administrator, Civil Service (Medical): No    Lack of Transportation (Non-Medical): No  Physical Activity: Inactive (09/21/2020)   Exercise Vital Sign    Days of  Exercise per Week: 0 days    Minutes of Exercise per Session: 0 min  Stress: Not on file  Social Connections: Not on file  Intimate Partner Violence: Not on file  Review of Systems    General:  No chills, fever, night sweats or weight changes.  Cardiovascular:  No chest pain, dyspnea on exertion, edema, orthopnea, palpitations, paroxysmal nocturnal dyspnea. Dermatological: No rash, lesions/masses Respiratory: No cough, dyspnea Urologic: No hematuria, dysuria Abdominal:   No nausea, vomiting, diarrhea, bright red blood per rectum, melena, or hematemesis Neurologic:  No visual changes, wkns, changes in mental status. All other systems reviewed and are otherwise negative except as noted above.  Physical Exam    VS:  BP 114/80 (BP Location: Left Arm, Patient Position: Sitting, Cuff Size: Large)   Pulse 85   Ht 5\' 10"  (1.778 m)   Wt (!) 371 lb 12.8 oz (168.6 kg)   SpO2 97%   BMI 53.35 kg/m  , BMI Body mass index is 53.35 kg/m. GEN: Well nourished, well developed, in no acute distress. HEENT: normal. Neck: Supple, no JVD, carotid bruits, or masses. Cardiac: RRR, no murmurs, rubs, or gallops. No clubbing, cyanosis, edema.  Radials/DP/PT 2+ and equal bilaterally.  Respiratory:  Respirations regular and unlabored, clear to auscultation bilaterally. GI: Soft, nontender, nondistended, BS + x 4. MS: no deformity or atrophy. Skin: warm and dry, no rash. Neuro:  Strength and sensation are intact. Psych: Normal affect.  Accessory Clinical Findings    Recent Labs: 05/15/2022: BUN 13; Creatinine, Ser 0.90; Hemoglobin 16.2; Platelets 203; Potassium 4.2; Sodium 138   Recent Lipid Panel    Component Value Date/Time   CHOL 87 01/27/2022 1136   TRIG 86 01/27/2022 1136   HDL 29 (L) 01/27/2022 1136   CHOLHDL 3.0 01/27/2022 1136   VLDL 17 01/27/2022 1136   LDLCALC 41 01/27/2022 1136        ECG personally reviewed by me today-none today.     Echocardiogram 11/10/2021 IMPRESSIONS      1. Left ventricular ejection fraction, by estimation, is <20%. The left  ventricle has severely decreased function. The left ventricle demonstrates  regional wall motion abnormalities (see scoring diagram/findings for  description). The left ventricular  internal cavity size was mildly dilated. Left ventricular diastolic  parameters are indeterminate.   2. Right ventricular systolic function is mildly reduced. The right  ventricular size is normal. There is normal pulmonary artery systolic  pressure.   3. The mitral valve is normal in structure. No evidence of mitral valve  regurgitation. No evidence of mitral stenosis.   4. The aortic valve is tricuspid. Aortic valve regurgitation is not  visualized. No aortic stenosis is present.   5. The inferior vena cava is normal in size with greater than 50%  respiratory variability, suggesting right atrial pressure of 3 mmHg.   Comparison(s): The left ventricular function is worsened. The left  ventricular wall motion abnormalities are unchanged.   FINDINGS   Left Ventricle: Left ventricular ejection fraction, by estimation, is  <20%. The left ventricle has severely decreased function. The left  ventricle demonstrates regional wall motion abnormalities. The left  ventricular internal cavity size was mildly  dilated. There is no left ventricular hypertrophy. Left ventricular  diastolic parameters are indeterminate.     LV Wall Scoring:  The entire inferior wall, mid inferoseptal segment, and basal inferoseptal  segment are akinetic. The entire anterior wall, entire lateral wall,  entire  anterior septum, and apex are hypokinetic.   Right Ventricle: The right ventricular size is normal. No increase in  right ventricular wall thickness. Right ventricular systolic function is  mildly reduced. There is normal pulmonary artery systolic pressure. The  tricuspid regurgitant velocity is 1.38  m/s, and with an assumed right atrial pressure  of 3 mmHg, the estimated  right ventricular systolic pressure is 10.6 mmHg.   Left Atrium: Left atrial size was normal in size.   Right Atrium: Right atrial size was normal in size.   Pericardium: There is no evidence of pericardial effusion.   Mitral Valve: The mitral valve is normal in structure. No evidence of  mitral valve regurgitation. No evidence of mitral valve stenosis.   Tricuspid Valve: The tricuspid valve is normal in structure. Tricuspid  valve regurgitation is trivial. No evidence of tricuspid stenosis.   Aortic Valve: The aortic valve is tricuspid. Aortic valve regurgitation is  not visualized. No aortic stenosis is present.   Pulmonic Valve: The pulmonic valve was normal in structure. Pulmonic valve  regurgitation is not visualized. No evidence of pulmonic stenosis.   Aorta: The aortic root is normal in size and structure.   Venous: The inferior vena cava is normal in size with greater than 50%  respiratory variability, suggesting right atrial pressure of 3 mmHg.   IAS/Shunts: No atrial level shunt detected by color flow Doppler.    Assessment & Plan   1.  Chronic systolic CHF-euvolemic.  Status post CRT-D implant 05/19/2022.  Functionally doing well. Heart healthy low-sodium diet Continue current medical therapy Maintain physical activity Daily weights-contact office with a weight increase of 2 to 3 pounds overnight or 5 pounds in 1 week.  Hyperlipidemia-LDL 41 on 01/27/22. High-fiber diet Continue atorvastatin, aspirin Repeat fasting lipids and LFTs  Essential hypertension-BP today 119/80. Maintain blood pressure log Continue carvedilol  PVCs-noted to have 6% PVC burden on recent interrogation. Avoid triggers caffeine, chocolate, EtOH, dehydration etc. Continue carvedilol Follows with EP  Disposition: Follow-up with Dr. Herbie Baltimore or me in 9-12 months.   Thomasene Ripple. Stephenie Navejas NP-C     04/10/2023, 11:19 AM [facility] [address] Office [phone] Fax [phone]    I spent 15 minutes examining this patient, reviewing medications, and using patient centered shared decision making involving her cardiac care.   I spent greater than 20 minutes reviewing her past medical history,  medications, and prior cardiac tests.

## 2023-04-10 ENCOUNTER — Encounter: Payer: Self-pay | Admitting: General Practice

## 2023-04-10 ENCOUNTER — Ambulatory Visit: Payer: BLUE CROSS/BLUE SHIELD | Attending: General Practice | Admitting: General Practice

## 2023-04-10 VITALS — BP 114/80 | HR 85 | Ht 70.0 in | Wt 371.8 lb

## 2023-04-10 DIAGNOSIS — I493 Ventricular premature depolarization: Secondary | ICD-10-CM

## 2023-04-10 DIAGNOSIS — E782 Mixed hyperlipidemia: Secondary | ICD-10-CM | POA: Diagnosis not present

## 2023-04-10 DIAGNOSIS — I10 Essential (primary) hypertension: Secondary | ICD-10-CM

## 2023-04-10 DIAGNOSIS — I5022 Chronic systolic (congestive) heart failure: Secondary | ICD-10-CM | POA: Diagnosis not present

## 2023-04-10 MED ORDER — ATORVASTATIN CALCIUM 40 MG PO TABS: 40.0000 mg | ORAL_TABLET | Freq: Every day | ORAL | 3 refills | Status: AC

## 2023-04-10 MED ORDER — ENTRESTO 49-51 MG PO TABS: 1.0000 | ORAL_TABLET | Freq: Two times a day (BID) | ORAL | 3 refills | Status: AC

## 2023-04-10 MED ORDER — DAPAGLIFLOZIN PROPANEDIOL 10 MG PO TABS: 10.0000 mg | ORAL_TABLET | Freq: Every day | ORAL | 3 refills | Status: AC

## 2023-04-10 MED ORDER — CARVEDILOL 12.5 MG PO TABS: 12.5000 mg | ORAL_TABLET | Freq: Two times a day (BID) | ORAL | 3 refills | Status: AC

## 2023-04-10 MED ORDER — SPIRONOLACTONE 25 MG PO TABS: 25.0000 mg | ORAL_TABLET | Freq: Every day | ORAL | 3 refills | Status: AC

## 2023-04-10 MED ORDER — FUROSEMIDE 40 MG PO TABS: 40.0000 mg | ORAL_TABLET | Freq: Every day | ORAL | 3 refills | Status: AC

## 2023-04-10 NOTE — Patient Instructions (Signed)
Medication Instructions:  The current medical regimen is effective;  continue present plan and medications as directed. Please refer to the Current Medication list given to you today.  *If you need a refill on your cardiac medications before your next appointment, please call your pharmacy*  Lab Work: CBC,BMET AND  LIPID If you have labs (blood work) drawn today and your tests are completely normal, you will receive your results only by:  MyChart Message (if you have MyChart) OR  A paper copy in the mail If you have any lab test that is abnormal or we need to change your treatment, we will call you to review the results.  Other Instructions PLEASE READ AND FOLLOW ATTACHED  SALTY 6  MAINTAIN YOUR PHYSICAL ACTIVITY-AS TOLERATED  Follow-Up: At [facility], you and your health needs are our priority.  As part of our continuing mission to provide you with exceptional heart care, we have created designated Provider Care Teams.  These Care Teams include your primary Cardiologist (physician) and Advanced Practice Providers (APPs -  Physician Assistants and Nurse Practitioners) who all work together to provide you with the care you need, when you need it.  Your next appointment:   NONE   Provider:   Bryan Lemma, MD  or Edd Fabian, FNP

## 2023-04-11 LAB — COMPREHENSIVE METABOLIC PANEL
ALT: 21 [IU]/L (ref 0–44)
AST: 19 [IU]/L (ref 0–40)
Albumin: 4.4 g/dL (ref 4.1–5.1)
Alkaline Phosphatase: 134 [IU]/L — ABNORMAL HIGH (ref 44–121)
BUN/Creatinine Ratio: 8 — ABNORMAL LOW (ref 9–20)
BUN: 6 mg/dL (ref 6–24)
Bilirubin Total: 0.3 mg/dL (ref 0.0–1.2)
CO2: 23 mmol/L (ref 20–29)
Calcium: 9.4 mg/dL (ref 8.7–10.2)
Chloride: 101 mmol/L (ref 96–106)
Creatinine, Ser: 0.75 mg/dL — ABNORMAL LOW (ref 0.76–1.27)
Globulin, Total: 2.8 g/dL (ref 1.5–4.5)
Glucose: 118 mg/dL — ABNORMAL HIGH (ref 70–99)
Potassium: 4.3 mmol/L (ref 3.5–5.2)
Sodium: 138 mmol/L (ref 134–144)
Total Protein: 7.2 g/dL (ref 6.0–8.5)
eGFR: 115 mL/min/{1.73_m2} (ref >59)

## 2023-04-11 LAB — LIPID PANEL
Chol/HDL Ratio: 2.6 {ratio} (ref 0.0–5.0)
Cholesterol, Total: 87 mg/dL — ABNORMAL LOW (ref 100–199)
HDL: 34 mg/dL — ABNORMAL LOW (ref >39)
LDL Chol Calc (NIH): 38 mg/dL (ref 0–99)
Triglycerides: 65 mg/dL (ref 0–149)
VLDL Cholesterol Cal: 15 mg/dL (ref 5–40)

## 2023-04-11 LAB — CBC
Hematocrit: 48.8 % (ref 37.5–51.0)
Hemoglobin: 15.9 g/dL (ref 13.0–17.7)
MCH: 28.1 pg (ref 26.6–33.0)
MCHC: 32.6 g/dL (ref 31.5–35.7)
MCV: 86 fL (ref 79–97)
Platelets: 219 10*3/uL (ref 150–450)
RBC: 5.65 x10E6/uL (ref 4.14–5.80)
RDW: 13.2 % (ref 11.6–15.4)
WBC: 8.1 10*3/uL (ref 3.4–10.8)

## 2023-05-22 ENCOUNTER — Ambulatory Visit (INDEPENDENT_AMBULATORY_CARE_PROVIDER_SITE_OTHER): Payer: BLUE CROSS/BLUE SHIELD

## 2023-05-22 DIAGNOSIS — I428 Other cardiomyopathies: Secondary | ICD-10-CM

## 2023-05-22 DIAGNOSIS — I5022 Chronic systolic (congestive) heart failure: Secondary | ICD-10-CM

## 2023-05-22 LAB — CUP PACEART REMOTE DEVICE CHECK
Battery Remaining Longevity: 50 mo
Battery Remaining Percentage: 78 %
Battery Voltage: 2.96 V
Brady Statistic AP VP Percent: 1 %
Brady Statistic AP VS Percent: 1 %
Brady Statistic AS VP Percent: 99 %
Brady Statistic AS VS Percent: 1 %
Brady Statistic RA Percent Paced: 1 %
Date Time Interrogation Session: 20241126010031
HighPow Impedance: 80 Ohm
Implantable Lead Connection Status: 753985
Implantable Lead Connection Status: 753985
Implantable Lead Connection Status: 753985
Implantable Lead Implant Date: 20231124
Implantable Lead Implant Date: 20231124
Implantable Lead Implant Date: 20231124
Implantable Lead Location: 753858
Implantable Lead Location: 753859
Implantable Lead Location: 753860
Implantable Pulse Generator Implant Date: 20231124
Lead Channel Impedance Value: 450 Ohm
Lead Channel Impedance Value: 530 Ohm
Lead Channel Impedance Value: 630 Ohm
Lead Channel Pacing Threshold Amplitude: 0.75 V
Lead Channel Pacing Threshold Amplitude: 1 V
Lead Channel Pacing Threshold Amplitude: 3.5 V
Lead Channel Pacing Threshold Pulse Width: 0.3 ms
Lead Channel Pacing Threshold Pulse Width: 0.5 ms
Lead Channel Pacing Threshold Pulse Width: 1 ms
Lead Channel Sensing Intrinsic Amplitude: 11.5 mV
Lead Channel Sensing Intrinsic Amplitude: 5 mV
Lead Channel Setting Pacing Amplitude: 1.25 V
Lead Channel Setting Pacing Amplitude: 2 V
Lead Channel Setting Pacing Amplitude: 4 V
Lead Channel Setting Pacing Pulse Width: 0.3 ms
Lead Channel Setting Pacing Pulse Width: 1 ms
Lead Channel Setting Sensing Sensitivity: 0.5 mV
Pulse Gen Serial Number: 211001542
Zone Setting Status: 755011

## 2023-06-21 NOTE — Progress Notes (Signed)
Remote ICD transmission.   

## 2023-08-21 ENCOUNTER — Ambulatory Visit (INDEPENDENT_AMBULATORY_CARE_PROVIDER_SITE_OTHER): Payer: BC Managed Care – PPO

## 2023-08-21 DIAGNOSIS — I428 Other cardiomyopathies: Secondary | ICD-10-CM | POA: Diagnosis not present

## 2023-08-22 LAB — CUP PACEART REMOTE DEVICE CHECK
Battery Remaining Longevity: 49 mo
Battery Remaining Percentage: 74 %
Battery Voltage: 2.96 V
Brady Statistic AP VP Percent: 1 %
Brady Statistic AP VS Percent: 1 %
Brady Statistic AS VP Percent: 99 %
Brady Statistic AS VS Percent: 1 %
Brady Statistic RA Percent Paced: 1 %
Date Time Interrogation Session: 20250225020234
HighPow Impedance: 81 Ohm
Implantable Lead Connection Status: 753985
Implantable Lead Connection Status: 753985
Implantable Lead Connection Status: 753985
Implantable Lead Implant Date: 20231124
Implantable Lead Implant Date: 20231124
Implantable Lead Implant Date: 20231124
Implantable Lead Location: 753858
Implantable Lead Location: 753859
Implantable Lead Location: 753860
Implantable Pulse Generator Implant Date: 20231124
Lead Channel Impedance Value: 500 Ohm
Lead Channel Impedance Value: 550 Ohm
Lead Channel Impedance Value: 660 Ohm
Lead Channel Pacing Threshold Amplitude: 0.75 V
Lead Channel Pacing Threshold Amplitude: 0.75 V
Lead Channel Pacing Threshold Amplitude: 3.25 V
Lead Channel Pacing Threshold Pulse Width: 0.3 ms
Lead Channel Pacing Threshold Pulse Width: 0.5 ms
Lead Channel Pacing Threshold Pulse Width: 1 ms
Lead Channel Sensing Intrinsic Amplitude: 11.5 mV
Lead Channel Sensing Intrinsic Amplitude: 5 mV
Lead Channel Setting Pacing Amplitude: 1.25 V
Lead Channel Setting Pacing Amplitude: 1.75 V
Lead Channel Setting Pacing Amplitude: 3.75 V
Lead Channel Setting Pacing Pulse Width: 0.3 ms
Lead Channel Setting Pacing Pulse Width: 1 ms
Lead Channel Setting Sensing Sensitivity: 0.5 mV
Pulse Gen Serial Number: 211001542
Zone Setting Status: 755011

## 2023-08-29 ENCOUNTER — Other Ambulatory Visit: Payer: Self-pay | Admitting: Cardiology

## 2023-08-29 DIAGNOSIS — I2510 Atherosclerotic heart disease of native coronary artery without angina pectoris: Secondary | ICD-10-CM

## 2023-09-26 NOTE — Addendum Note (Signed)
 Addended by: Elease Etienne A on: 09/26/2023 12:41 PM   Modules accepted: Orders

## 2023-09-26 NOTE — Progress Notes (Signed)
 Remote ICD transmission.

## 2023-10-24 ENCOUNTER — Telehealth: Payer: Self-pay | Admitting: Cardiology

## 2023-10-24 NOTE — Telephone Encounter (Signed)
 Ace Holder from [facility] calling to see if we rec'vd transfer device request for pt and if it has been accepted. Requesting cb

## 2023-10-24 NOTE — Telephone Encounter (Signed)
 Released Pt in [employer].  Need to contact Ace Holder to advise.

## 2023-10-25 NOTE — Telephone Encounter (Signed)
 Attempted to contact facility. No answer, unable to leave VM.

## 2023-11-20 ENCOUNTER — Ambulatory Visit: Payer: BC Managed Care – PPO

## 2024-02-19 ENCOUNTER — Ambulatory Visit: Payer: BC Managed Care – PPO

## 2024-05-20 ENCOUNTER — Ambulatory Visit: Payer: BC Managed Care – PPO
